# Patient Record
Sex: Female | Born: 1975
Health system: Southern US, Community
[De-identification: ages and names within clinical notes are randomized; demographics above are authoritative.]

## PROBLEM LIST (undated history)

## (undated) DIAGNOSIS — J42 Unspecified chronic bronchitis: Secondary | ICD-10-CM

## (undated) DIAGNOSIS — E119 Type 2 diabetes mellitus without complications: Secondary | ICD-10-CM

## (undated) DIAGNOSIS — K219 Gastro-esophageal reflux disease without esophagitis: Secondary | ICD-10-CM

## (undated) DIAGNOSIS — T7840XA Allergy, unspecified, initial encounter: Secondary | ICD-10-CM

## (undated) HISTORY — DX: Allergy, unspecified, initial encounter: T78.40XA

## (undated) HISTORY — DX: Unspecified chronic bronchitis: J42

## (undated) HISTORY — DX: Type 2 diabetes mellitus without complications: E11.9

## (undated) HISTORY — PX: TONSILECTOMY, ADENOIDECTOMY, BILATERAL MYRINGOTOMY AND TUBES: SHX2538

## (undated) HISTORY — DX: Gastro-esophageal reflux disease without esophagitis: K21.9

---

## 2018-12-21 ENCOUNTER — Other Ambulatory Visit: Payer: Self-pay

## 2018-12-21 DIAGNOSIS — Z20822 Contact with and (suspected) exposure to covid-19: Secondary | ICD-10-CM

## 2018-12-22 ENCOUNTER — Telehealth: Payer: Self-pay | Admitting: General Practice

## 2018-12-22 LAB — NOVEL CORONAVIRUS, NAA: SARS-CoV-2, NAA: NOT DETECTED

## 2018-12-22 NOTE — Telephone Encounter (Signed)
Negative COVID results given. Patient results "NOT Detected." Caller expressed understanding. ° °

## 2019-05-17 ENCOUNTER — Other Ambulatory Visit: Payer: Self-pay | Admitting: Sports Medicine

## 2019-05-17 DIAGNOSIS — M25511 Pain in right shoulder: Secondary | ICD-10-CM

## 2019-05-25 ENCOUNTER — Ambulatory Visit
Admission: RE | Admit: 2019-05-25 | Discharge: 2019-05-25 | Disposition: A | Discharge status: Home / Self Care | Payer: 59 | Source: Ambulatory Visit | Attending: Sports Medicine | Admitting: Sports Medicine

## 2019-05-25 DIAGNOSIS — M25511 Pain in right shoulder: Secondary | ICD-10-CM

## 2019-06-16 ENCOUNTER — Other Ambulatory Visit: Payer: Self-pay | Admitting: Internal Medicine

## 2019-06-16 DIAGNOSIS — Z1231 Encounter for screening mammogram for malignant neoplasm of breast: Secondary | ICD-10-CM

## 2019-06-23 ENCOUNTER — Other Ambulatory Visit: Payer: Self-pay | Admitting: Internal Medicine

## 2019-06-23 DIAGNOSIS — S2002XA Contusion of left breast, initial encounter: Secondary | ICD-10-CM

## 2019-06-30 ENCOUNTER — Other Ambulatory Visit: Payer: Self-pay

## 2019-06-30 ENCOUNTER — Other Ambulatory Visit (HOSPITAL_COMMUNITY): Payer: Self-pay | Admitting: Internal Medicine

## 2019-06-30 ENCOUNTER — Ambulatory Visit (HOSPITAL_COMMUNITY)
Admission: RE | Admit: 2019-06-30 | Discharge: 2019-06-30 | Disposition: A | Discharge status: Home / Self Care | Payer: 59 | Source: Ambulatory Visit | Attending: Internal Medicine | Admitting: Internal Medicine

## 2019-06-30 DIAGNOSIS — R05 Cough: Secondary | ICD-10-CM

## 2019-06-30 DIAGNOSIS — R059 Cough, unspecified: Secondary | ICD-10-CM

## 2019-07-07 ENCOUNTER — Ambulatory Visit: Payer: 59

## 2019-07-07 ENCOUNTER — Other Ambulatory Visit: Payer: Self-pay

## 2019-07-07 ENCOUNTER — Ambulatory Visit
Admission: RE | Admit: 2019-07-07 | Discharge: 2019-07-07 | Disposition: A | Discharge status: Home / Self Care | Payer: 59 | Source: Ambulatory Visit | Attending: Internal Medicine | Admitting: Internal Medicine

## 2019-07-07 ENCOUNTER — Other Ambulatory Visit: Payer: Self-pay | Admitting: Internal Medicine

## 2019-07-07 DIAGNOSIS — N6310 Unspecified lump in the right breast, unspecified quadrant: Secondary | ICD-10-CM

## 2019-07-07 DIAGNOSIS — S2002XA Contusion of left breast, initial encounter: Secondary | ICD-10-CM

## 2020-01-11 ENCOUNTER — Other Ambulatory Visit: Payer: 59
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
## 2021-10-21 ENCOUNTER — Other Ambulatory Visit: Payer: Self-pay

## 2021-10-21 ENCOUNTER — Encounter: Payer: Self-pay | Admitting: Physician Assistant

## 2021-10-21 ENCOUNTER — Ambulatory Visit: Payer: BC Managed Care – PPO | Admitting: Physician Assistant

## 2021-10-21 VITALS — BP 136/86 | HR 80 | Temp 97.8°F | Ht 62.68 in | Wt 241.0 lb

## 2021-10-21 DIAGNOSIS — Z23 Encounter for immunization: Secondary | ICD-10-CM

## 2021-10-21 DIAGNOSIS — Z114 Encounter for screening for human immunodeficiency virus [HIV]: Secondary | ICD-10-CM | POA: Diagnosis not present

## 2021-10-21 DIAGNOSIS — Z1159 Encounter for screening for other viral diseases: Secondary | ICD-10-CM

## 2021-10-21 DIAGNOSIS — R1011 Right upper quadrant pain: Secondary | ICD-10-CM

## 2021-10-21 DIAGNOSIS — E1165 Type 2 diabetes mellitus with hyperglycemia: Secondary | ICD-10-CM

## 2021-10-21 DIAGNOSIS — Z13220 Encounter for screening for lipoid disorders: Secondary | ICD-10-CM

## 2021-10-21 DIAGNOSIS — H60543 Acute eczematoid otitis externa, bilateral: Secondary | ICD-10-CM

## 2021-10-21 DIAGNOSIS — D1803 Hemangioma of intra-abdominal structures: Secondary | ICD-10-CM

## 2021-10-21 LAB — COMPREHENSIVE METABOLIC PANEL
ALT: 12 U/L (ref 0–35)
AST: 11 U/L (ref 0–37)
Albumin: 4.1 g/dL (ref 3.5–5.2)
Alkaline Phosphatase: 83 U/L (ref 39–117)
BUN: 9 mg/dL (ref 6–23)
CO2: 29 mEq/L (ref 19–32)
Calcium: 9.3 mg/dL (ref 8.4–10.5)
Chloride: 101 mEq/L (ref 96–112)
Creatinine, Ser: 0.56 mg/dL (ref 0.40–1.20)
GFR: 110.83 mL/min (ref >60.00)
Glucose, Bld: 208 mg/dL — ABNORMAL HIGH (ref 70–99)
Potassium: 4 mEq/L (ref 3.5–5.1)
Sodium: 139 mEq/L (ref 135–145)
Total Bilirubin: 1 mg/dL (ref 0.2–1.2)
Total Protein: 7.3 g/dL (ref 6.0–8.3)

## 2021-10-21 LAB — LIPID PANEL
Cholesterol: 246 mg/dL — ABNORMAL HIGH (ref 0–200)
HDL: 40.5 mg/dL (ref >39.00)
LDL Cholesterol: 181 mg/dL — ABNORMAL HIGH (ref 0–99)
NonHDL: 205.43
Total CHOL/HDL Ratio: 6
Triglycerides: 124 mg/dL (ref 0.0–149.0)
VLDL: 24.8 mg/dL (ref 0.0–40.0)

## 2021-10-21 LAB — CBC WITH DIFFERENTIAL/PLATELET
Basophils Absolute: 0.1 10*3/uL (ref 0.0–0.1)
Basophils Relative: 0.7 % (ref 0.0–3.0)
Eosinophils Absolute: 0.1 10*3/uL (ref 0.0–0.7)
Eosinophils Relative: 1.7 % (ref 0.0–5.0)
HCT: 40.4 % (ref 36.0–46.0)
Hemoglobin: 13.8 g/dL (ref 12.0–15.0)
Lymphocytes Relative: 26.8 % (ref 12.0–46.0)
Lymphs Abs: 2.2 10*3/uL (ref 0.7–4.0)
MCHC: 34.2 g/dL (ref 30.0–36.0)
MCV: 97.9 fl (ref 78.0–100.0)
Monocytes Absolute: 0.3 10*3/uL (ref 0.1–1.0)
Monocytes Relative: 3.2 % (ref 3.0–12.0)
Neutro Abs: 5.6 10*3/uL (ref 1.4–7.7)
Neutrophils Relative %: 67.6 % (ref 43.0–77.0)
Platelets: 239 10*3/uL (ref 150.0–400.0)
RBC: 4.13 Mil/uL (ref 3.87–5.11)
RDW: 13.1 % (ref 11.5–15.5)
WBC: 8.3 10*3/uL (ref 4.0–10.5)

## 2021-10-21 LAB — HEMOGLOBIN A1C: Hgb A1c MFr Bld: 10.1 % — ABNORMAL HIGH (ref 4.6–6.5)

## 2021-10-21 LAB — TSH: TSH: 1.51 u[IU]/mL (ref 0.35–5.50)

## 2021-10-21 MED ORDER — FLUOCINOLONE ACETONIDE 0.01 % OT OIL: 1.0000 [drp] | TOPICAL_OIL | Freq: Two times a day (BID) | OTIC | 0 refills | Status: AC
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
## 2021-10-21 NOTE — Patient Instructions (Addendum)
Welcome to [facility]! It was a pleasure meeting you today.  As discussed, Please schedule a 1-2 month f/up to recheck on Mounjaro and also update Well Woman Exam.  Fasting labs today Tetanus updated today Call sooner if any concerns  PLEASE NOTE:  If you had any LAB tests please let us know if you have not heard back within a few days. You may see your results on MyChart before we have a chance to review them but we will give you a call once they are reviewed by [place]. If we ordered any REFERRALS today, please let us know if you have not heard from their office within the next two weeks. Let us know through MyChart if you are needing REFILLS, or have your pharmacy send [place] the request. You can also use MyChart to communicate with me or any office staff.  Please try these tips to maintain a healthy lifestyle:  Eat most of your calories during the day when you are active. Eliminate processed foods including packaged sweets (pies, cakes, cookies), reduce intake of potatoes, white bread, white pasta, and white rice. Look for whole grain options, oat flour or almond flour.  Each meal should contain half fruits/vegetables, one quarter protein, and one quarter carbs (no bigger than a computer mouse).  Cut down on sweet beverages. This includes juice, soda, and sweet tea. Also watch fruit intake, though this is a healthier sweet option, it still contains natural sugar! Limit to 3 servings daily.  Drink at least 1 glass of water with each meal and aim for at least 8 glasses (64 ounces) per day.  Exercise at least 150 minutes every week to the best of your ability.    Take Care,  Tametria Aho, PA-C

## 2021-10-22 LAB — HEPATITIS C ANTIBODY: Hepatitis C Ab: NONREACTIVE

## 2021-10-22 LAB — HIV ANTIBODY (ROUTINE TESTING W REFLEX): HIV 1&2 Ab, 4th Generation: NONREACTIVE

## 2021-10-22 LAB — MICROALBUMIN / CREATININE URINE RATIO
Creatinine,U: 105.9 mg/dL
Microalb Creat Ratio: 5 mg/g (ref 0.0–30.0)
Microalb, Ur: 5.2 mg/dL — ABNORMAL HIGH (ref 0.0–1.9)

## 2021-10-25 ENCOUNTER — Other Ambulatory Visit: Payer: Self-pay

## 2021-10-25 MED ORDER — FLUCONAZOLE 150 MG PO TABS: 150.0000 mg | ORAL_TABLET | ORAL | 0 refills | Status: DC | PRN

## 2021-10-25 MED ORDER — ATORVASTATIN CALCIUM 10 MG PO TABS: 10.0000 mg | ORAL_TABLET | Freq: Every day | ORAL | 3 refills | Status: DC

## 2021-11-05 ENCOUNTER — Telehealth: Payer: Self-pay

## 2021-11-05 NOTE — Telephone Encounter (Signed)
Contacted pt and lvm advising to contact [facility] to schedule an appt for Tanya Zamora that was ordered. Per Lattie Haw pt needs to call and just schedule an appt; Also if she runs into any issues scheduling to let me know and we will attempt to schedule for her.

## 2021-11-18 ENCOUNTER — Encounter: Payer: Self-pay | Admitting: Physician Assistant

## 2021-11-18 ENCOUNTER — Telehealth: Payer: Self-pay

## 2021-11-18 ENCOUNTER — Ambulatory Visit: Payer: BC Managed Care – PPO | Admitting: Physician Assistant

## 2021-11-18 VITALS — BP 136/86 | HR 85 | Temp 98.2°F | Ht 62.0 in | Wt 241.0 lb

## 2021-11-18 DIAGNOSIS — E1165 Type 2 diabetes mellitus with hyperglycemia: Secondary | ICD-10-CM | POA: Diagnosis not present

## 2021-11-18 DIAGNOSIS — R1011 Right upper quadrant pain: Secondary | ICD-10-CM

## 2021-11-18 DIAGNOSIS — E782 Mixed hyperlipidemia: Secondary | ICD-10-CM

## 2021-11-18 DIAGNOSIS — D1803 Hemangioma of intra-abdominal structures: Secondary | ICD-10-CM | POA: Diagnosis not present

## 2021-11-18 MED ORDER — TIRZEPATIDE 5 MG/0.5ML ~~LOC~~ SOAJ: 5.0000 mg | SUBCUTANEOUS | 2 refills | Status: DC

## 2021-11-18 NOTE — Assessment & Plan Note (Signed)
Recently starting Lipitor 10 mg.  Starting to have leg cramps.  Plan to discontinue this medication and see if symptoms resolve.  We may need to try a different statin or a much slower dose in the future.

## 2021-11-18 NOTE — Assessment & Plan Note (Signed)
As noted on previous ultrasound from last year.  My CMA was able to get her scheduled for repeat ultrasound this week.  If any changes, may proceed with MRI at that time.

## 2021-11-18 NOTE — Patient Instructions (Signed)
STOP the Lipitor - let me know in the next few weeks if cramping resolves.  Increase Mounjaro to 5 mg once weekly - Rx sent to pharmacy - take Coupon Code from online

## 2021-11-18 NOTE — Telephone Encounter (Signed)
ICEL CASTLES KeyColleen Can -  Rx #: [id number]  Outcome Approved  Effective from 11/18/2021 through 11/17/2022.  Drug Mounjaro '5MG'$ /0.5ML pen-injectors

## 2021-11-18 NOTE — Progress Notes (Signed)
Subjective:    Patient ID: Tanya Zamora, female    DOB: 30-Oct-1975, 46 y.o.   MRN: [id number]  Chief Complaint  Patient presents with   Follow-up    Pt in for 4 wk f/u; pt thinks shot is going well it has curbed appetite but notices 2-3 days before taking next shot appetite is back up, wondering if she needs to go up on dosage? Pt has been active in weight loss exercise program 2-3 days per week for 6 months with little results, also counting calories and macros. Pt is fasting incase wanting to count this as physical and fasting labs    HPI Patient is in today for f/up.  T2DM -  Taking Mounjaro - only had sick feeling after 1st shot, o/w tolerating well now. Drinking more water. Trying to walk more, but says she walks constantly at work. Active in wt loss exercise program for the last 6 months w/o results. She is counting calories and macros.   Occasional sharp pain in RUQ - originally thought GB issues - abnormal US abdomen 08/15/20 -  "ADDENDUM: 1.2 x 0.8 x 1.0 cm echogenic lesion noted in the inferior tip of the left hepatic lobe, likely a hemangioma. Recommend contrast enhanced MRI in 6 months to document stability and confirm diagnosis." -Pt did not f/up on this b/c she lost insurance soon after  Starting to have some cramping in legs at night and in the mornings. Just started lipitor 10 mg in the last few weeks.   Past Medical History:  Diagnosis Date   Allergy    Chronic bronchitis (HCC)    Diabetes mellitus without complication (HCC)    GERD (gastroesophageal reflux disease)     Past Surgical History:  Procedure Laterality Date   TONSILECTOMY, ADENOIDECTOMY, BILATERAL MYRINGOTOMY AND TUBES Bilateral     Family History  Problem Relation Age of Onset   Breast cancer Mother 80   Diabetes Mother    Stroke Mother    COPD Mother    Hypertension Mother    Hypertension Father    Cancer Father    Breast cancer Sister    Breast cancer Maternal Grandmother      Social History   Tobacco Use   Smoking status: Former    Packs/day: 1.00    Years: 30.00    Total pack years: 30.00    Types: Cigarettes    Quit date: 01/19/2021    Years since quitting: 0.8   Smokeless tobacco: Never  Vaping Use   Vaping Use: Every day  Substance Use Topics   Alcohol use: Not Currently   Drug use: Not Currently     Allergies  Allergen Reactions   Metformin And Related Diarrhea and Nausea And Vomiting    Review of Systems NEGATIVE UNLESS OTHERWISE INDICATED IN HPI      Objective:     BP 136/86 (BP Location: Left Arm)   Pulse 85   Temp 98.2 F (36.8 C) (Temporal)   Ht '5\' 2"'$  (1.575 m)   Wt 241 lb (109.3 kg)   LMP 11/13/2021 (Exact Date)   SpO2 95%   BMI 44.08 kg/m   Wt Readings from Last 3 Encounters:  11/18/21 241 lb (109.3 kg)  10/21/21 241 lb (109.3 kg)    BP Readings from Last 3 Encounters:  11/18/21 136/86  10/21/21 136/86     Physical Exam Constitutional:      General: She is not in acute distress.    Appearance: Normal appearance. She  is obese. She is not ill-appearing.  Eyes:     Extraocular Movements: Extraocular movements intact.     Pupils: Pupils are equal, round, and reactive to light.  Cardiovascular:     Rate and Rhythm: Normal rate and regular rhythm.     Heart sounds: Normal heart sounds.  Pulmonary:     Effort: Pulmonary effort is normal.     Breath sounds: Normal breath sounds.  Neurological:     General: No focal deficit present.     Mental Status: She is alert and oriented to person, place, and time.  Psychiatric:        Mood and Affect: Mood normal.        Behavior: Behavior normal.        Assessment & Plan:   Problem List Items Addressed This Visit       Cardiovascular and Mediastinum   Hemangioma of liver    As noted on previous ultrasound from last year.  My CMA was able to get her scheduled for repeat ultrasound this week.  If any changes, may proceed with MRI at that time.         Endocrine   Type 2 diabetes mellitus with hyperglycemia, without long-term current use of insulin ([place]) - Primary    Not due for next A1c yet Doing well with lifestyle changes She has completed 1 month of Mounjaro starting dose; plan to increase to the 5 mg once weekly for the next 4 to 8 weeks.  Advised coupon code online.  She will let me know if she is having any problems with this medication. Plan to recheck labs at next visit.      Relevant Medications   tirzepatide [facility]) 5 MG/0.5ML Pen     Other   Morbid obesity ([place])    Encouraged patient to keep working on lifestyle changes.  Darcel Bayley should also be helpful as well.      Relevant Medications   tirzepatide (MOUNJARO) 5 MG/0.5ML Pen   RUQ pain    Rare if ever anymore.  Initially thought gallbladder disease last year, but this was ruled out via ultrasound.  She does not have any pain today.      Mixed hyperlipidemia    Recently starting Lipitor 10 mg.  Starting to have leg cramps.  Plan to discontinue this medication and see if symptoms resolve.  We may need to try a different statin or a much slower dose in the future.        Meds ordered this encounter  Medications   tirzepatide (MOUNJARO) 5 MG/0.5ML Pen    Sig: Inject 5 mg into the skin once a week.    Dispense:  6 mL    Refill:  2    Order Specific Question:   Supervising Provider    Answer:   Marin Olp [[id number]]     Return in about 2 months (around 01/18/2022) for fasting labs, Well Woman Exam .   Randa Evens Itay Mella, PA-C

## 2021-11-18 NOTE — Assessment & Plan Note (Signed)
Encouraged patient to keep working on lifestyle changes.  Tanya Zamora should also be helpful as well.

## 2021-11-18 NOTE — Assessment & Plan Note (Signed)
Not due for next A1c yet Doing well with lifestyle changes She has completed 1 month of Mounjaro starting dose; plan to increase to the 5 mg once weekly for the next 4 to 8 weeks.  Advised coupon code online.  She will let me know if she is having any problems with this medication. Plan to recheck labs at next visit.

## 2021-11-18 NOTE — Assessment & Plan Note (Signed)
Rare if ever anymore.  Initially thought gallbladder disease last year, but this was ruled out via ultrasound.  She does not have any pain today.

## 2021-11-19 ENCOUNTER — Ambulatory Visit (HOSPITAL_COMMUNITY)
Admission: RE | Admit: 2021-11-19 | Discharge: 2021-11-19 | Disposition: A | Discharge status: Home / Self Care | Payer: BC Managed Care – PPO | Source: Ambulatory Visit | Attending: Physician Assistant | Admitting: Physician Assistant

## 2021-11-19 DIAGNOSIS — R945 Abnormal results of liver function studies: Secondary | ICD-10-CM | POA: Diagnosis not present

## 2021-11-19 DIAGNOSIS — R1011 Right upper quadrant pain: Secondary | ICD-10-CM

## 2021-11-19 DIAGNOSIS — D1803 Hemangioma of intra-abdominal structures: Secondary | ICD-10-CM | POA: Diagnosis not present

## 2021-12-16 ENCOUNTER — Encounter: Payer: Self-pay | Admitting: *Deleted

## 2022-01-21 ENCOUNTER — Ambulatory Visit: Payer: BC Managed Care – PPO | Admitting: Physician Assistant

## 2022-01-27 ENCOUNTER — Ambulatory Visit: Payer: BC Managed Care – PPO | Admitting: Physician Assistant

## 2022-01-28 ENCOUNTER — Telehealth (INDEPENDENT_AMBULATORY_CARE_PROVIDER_SITE_OTHER): Payer: BC Managed Care – PPO | Admitting: Physician Assistant

## 2022-01-28 ENCOUNTER — Telehealth: Payer: Self-pay

## 2022-01-28 VITALS — Temp 97.9°F | Ht 62.0 in | Wt 240.0 lb

## 2022-01-28 DIAGNOSIS — J069 Acute upper respiratory infection, unspecified: Secondary | ICD-10-CM

## 2022-01-28 DIAGNOSIS — A084 Viral intestinal infection, unspecified: Secondary | ICD-10-CM

## 2022-01-28 MED ORDER — ONDANSETRON HCL 8 MG PO TABS: 8.0000 mg | ORAL_TABLET | Freq: Three times a day (TID) | ORAL | 0 refills | Status: DC | PRN

## 2022-01-28 MED ORDER — CHERATUSSIN AC 100-10 MG/5ML PO SOLN: 5.0000 mL | Freq: Three times a day (TID) | ORAL | 0 refills | Status: DC | PRN

## 2022-01-28 MED ORDER — FLUTICASONE PROPIONATE HFA 44 MCG/ACT IN AERO: 2.0000 | INHALATION_SPRAY | Freq: Two times a day (BID) | RESPIRATORY_TRACT | 0 refills | Status: DC

## 2022-01-28 NOTE — Progress Notes (Signed)
   Virtual Visit via Video Note  I connected with  Tanya Zamora  on 01/28/22 at  1:45 PM EST by a video enabled telemedicine application and verified that I am speaking with the correct person using two identifiers.  Location: Patient: home Provider: Therapist, music at [place] present: Patient and myself   I discussed the limitations of evaluation and management by telemedicine and the availability of in person appointments. The patient expressed understanding and agreed to proceed.   History of Present Illness:  46 yo presents for virtual visit. Sick x 1 week. 3 home COVID tests all negative. Bad cough and congestion not letting up.  Pt having constant cough with no relief and no sleep spent past 24-36 hours with vomiting and diarrhea. Hx of bronchitis this time of year and always has this issue.   Nausea / vomiting is better today. Diarrhea is starting to slow down today.  No blood in the stool. Some dizziness, no passing out.  Has not felt feverish today.   No sick contacts at home.   Tx's at home: Tylenol and ibuprofen; tessalon perles don't help; has needed inhaler before and that was helpful   Observations/Objective:   Gen: Awake, alert, no acute distress, coughing, congested sounding Resp: Breathing is even and non-labored Psych: calm/pleasant demeanor Neuro: Alert and Oriented x 3, + facial symmetry, speech is clear.   Assessment and Plan:  1. Upper respiratory infection with cough and congestion Symptoms slowly improving. Treat conservatively at this time. Cheratussin & Flovent as directed to help with cough symptoms.  Pt aware of risks vs benefits and possible adverse reactions Fluids, rest, nasal saline.  2. Viral gastroenteritis Resolving Zofran 8 mg as directed prn BRATY diet   Follow Up Instructions:    I discussed the assessment and treatment plan with the patient. The patient was provided an opportunity to ask questions  and all were answered. The patient agreed with the plan and demonstrated an understanding of the instructions.   The patient was advised to call back or seek an in-person evaluation if the symptoms worsen or if the condition fails to improve as anticipated.  Samya Siciliano M Kami Kube, PA-C

## 2022-01-28 NOTE — Telephone Encounter (Signed)
Patient is needing a fill of zofran for current N/V and diarrhea

## 2022-01-28 NOTE — Telephone Encounter (Signed)
Noted -o/v completed today.

## 2022-02-11 ENCOUNTER — Ambulatory Visit: Payer: BC Managed Care – PPO | Admitting: Physician Assistant

## 2022-02-11 ENCOUNTER — Other Ambulatory Visit (HOSPITAL_COMMUNITY)
Admission: RE | Admit: 2022-02-11 | Discharge: 2022-02-11 | Disposition: A | Discharge status: Home / Self Care | Payer: BC Managed Care – PPO | Source: Ambulatory Visit | Attending: Physician Assistant | Admitting: Physician Assistant

## 2022-02-11 ENCOUNTER — Encounter: Payer: Self-pay | Admitting: Physician Assistant

## 2022-02-11 VITALS — BP 120/76 | HR 76 | Temp 97.1°F | Ht 62.6 in | Wt 238.0 lb

## 2022-02-11 DIAGNOSIS — Z01419 Encounter for gynecological examination (general) (routine) without abnormal findings: Secondary | ICD-10-CM | POA: Insufficient documentation

## 2022-02-11 DIAGNOSIS — Z0000 Encounter for general adult medical examination without abnormal findings: Secondary | ICD-10-CM

## 2022-02-11 DIAGNOSIS — E782 Mixed hyperlipidemia: Secondary | ICD-10-CM

## 2022-02-11 DIAGNOSIS — E1165 Type 2 diabetes mellitus with hyperglycemia: Secondary | ICD-10-CM

## 2022-02-11 DIAGNOSIS — J309 Allergic rhinitis, unspecified: Secondary | ICD-10-CM

## 2022-02-11 LAB — POC URINALSYSI DIPSTICK (AUTOMATED)
Bilirubin, UA: NEGATIVE
Blood, UA: NEGATIVE
Glucose, UA: POSITIVE — AB
Ketones, UA: NEGATIVE
Nitrite, UA: NEGATIVE
Protein, UA: NEGATIVE
Spec Grav, UA: 1.025 (ref 1.010–1.025)
Urobilinogen, UA: 0.2 E.U./dL
pH, UA: 6 (ref 5.0–8.0)

## 2022-02-11 LAB — POCT GLYCOSYLATED HEMOGLOBIN (HGB A1C): Hemoglobin A1C: 8.1 % — AB (ref 4.0–5.6)

## 2022-02-11 MED ORDER — PRAVASTATIN SODIUM 20 MG PO TABS: 20.0000 mg | ORAL_TABLET | ORAL | 2 refills | Status: DC

## 2022-02-11 MED ORDER — GLIPIZIDE ER 2.5 MG PO TB24: 2.5000 mg | ORAL_TABLET | Freq: Every day | ORAL | 2 refills | Status: DC

## 2022-02-11 MED ORDER — BLOOD GLUCOSE MONITOR KIT: PACK | 0 refills | Status: DC

## 2022-02-11 MED ORDER — FLUTICASONE PROPIONATE 50 MCG/ACT NA SUSP: 2.0000 | Freq: Every day | NASAL | 2 refills | Status: DC

## 2022-02-11 NOTE — Progress Notes (Signed)
Subjective:    Patient ID: Tanya Zamora, female    DOB: 1975/11/17, 46 y.o.   MRN: [id number]  Chief Complaint  Patient presents with   Gynecologic Exam    Pt in for Well woman exam; and follow up; just started back on Mounjaro, wants to continue same dose for now, also would like to discuss starting Flonase to help with seasonal allergies.     HPI Patient is in today for well woman exam.  Patient has not had a Pap smear in at least the last 12 years.  States that she does have a history of colposcopy over a decade ago.  Patient is turning 46 next month and will need a colonoscopy, she is willing to accept a referral today.  She also needs to follow-up today on her chronic medical conditions.  Past Medical History:  Diagnosis Date   Allergy    Chronic bronchitis ([place])    Diabetes mellitus without complication (HCC)    GERD (gastroesophageal reflux disease)     Past Surgical History:  Procedure Laterality Date   TONSILECTOMY, ADENOIDECTOMY, BILATERAL MYRINGOTOMY AND TUBES Bilateral     Family History  Problem Relation Age of Onset   Breast cancer Mother 58   Diabetes Mother    Stroke Mother    COPD Mother    Hypertension Mother    Hypertension Father    Cancer Father    Breast cancer Sister    Breast cancer Maternal Grandmother     Social History   Tobacco Use   Smoking status: Former    Packs/day: 1.00    Years: 30.00    Total pack years: 30.00    Types: Cigarettes    Quit date: 01/19/2021    Years since quitting: 1.0   Smokeless tobacco: Never  Vaping Use   Vaping Use: Every day  Substance Use Topics   Alcohol use: Not Currently   Drug use: Not Currently     Allergies  Allergen Reactions   Metformin And Related Diarrhea and Nausea And Vomiting    Review of Systems NEGATIVE UNLESS OTHERWISE INDICATED IN HPI      Objective:     BP 120/76 (BP Location: Left Arm)   Pulse 76   Temp (!) 97.1 F (36.2 C) (Temporal)   Ht 5' 2.6" (1.59 m)   Wt  238 lb (108 kg)   LMP 01/31/2022 (Approximate)   SpO2 98%   BMI 42.70 kg/m   Wt Readings from Last 3 Encounters:  02/11/22 238 lb (108 kg)  01/28/22 240 lb (108.9 kg)  11/18/21 241 lb (109.3 kg)    BP Readings from Last 3 Encounters:  02/11/22 120/76  11/18/21 136/86  10/21/21 136/86     Physical Exam Vitals and nursing note reviewed. Exam conducted with a chaperone present.  Constitutional:      Appearance: Normal appearance. She is obese. She is not toxic-appearing.  HENT:     Head: Normocephalic and atraumatic.     Right Ear: Tympanic membrane, ear canal and external ear normal.     Left Ear: Tympanic membrane, ear canal and external ear normal.     Nose: Nose normal.     Mouth/Throat:     Mouth: Mucous membranes are moist.  Eyes:     Extraocular Movements: Extraocular movements intact.     Conjunctiva/sclera: Conjunctivae normal.     Pupils: Pupils are equal, round, and reactive to light.  Cardiovascular:     Rate and Rhythm: Normal rate  and regular rhythm.     Pulses: Normal pulses.     Heart sounds: Normal heart sounds.  Pulmonary:     Effort: Pulmonary effort is normal.     Breath sounds: Normal breath sounds.  Chest:     Chest wall: No mass, swelling or tenderness.  Breasts:    Right: Normal. No swelling, bleeding, inverted nipple, mass, nipple discharge, skin change or tenderness.     Left: Normal. No swelling, bleeding, inverted nipple, mass, nipple discharge, skin change or tenderness.  Abdominal:     General: Abdomen is flat. Bowel sounds are normal.     Palpations: Abdomen is soft.     Hernia: There is no hernia in the left inguinal area or right inguinal area.  Genitourinary:    General: Normal vulva.     Labia:        Right: No rash, tenderness or lesion.        Left: No rash, tenderness or lesion.      Vagina: Normal.     Cervix: Normal.     Uterus: Normal.      Adnexa: Right adnexa normal and left adnexa normal.  Musculoskeletal:         General: Normal range of motion.     Cervical back: Normal range of motion and neck supple.  Lymphadenopathy:     Upper Body:     Right upper body: No supraclavicular or axillary adenopathy.     Left upper body: No supraclavicular or axillary adenopathy.  Skin:    General: Skin is warm and dry.  Neurological:     General: No focal deficit present.     Mental Status: She is alert and oriented to person, place, and time.  Psychiatric:        Mood and Affect: Mood normal.        Behavior: Behavior normal.        Thought Content: Thought content normal.        Judgment: Judgment normal.        Assessment & Plan:  Encounter for well woman exam Assessment & Plan: Well woman exam completed today.  She is going to schedule on her own for mammogram.  We will call with Pap results.  Referral placed for colonoscopy.  Encouraged patient to keep working on lifestyle habits.  Orders: -     Cytology - PAP -     POCT Urinalysis Dipstick (Automated)  Type 2 diabetes mellitus with hyperglycemia, without long-term current use of insulin (HCC) Assessment & Plan: Lab Results  Component Value Date   HGBA1C 8.1 (A) 02/11/2022   Has significant improvement in her hemoglobin A1c.  Plan to increase Mounjaro to 5 mg once weekly.  She was unable to tolerate metformin.  I do think she would benefit from additional glucose control with the [facility].  Plan to start on glipizide XL 2.5 mg once daily.  Possible side effects cautioned.  Provided sample glucometer to monitor sugars intermittently. Strongly emphasized keep working on lifestyle changes.   Orders: -     POCT glycosylated hemoglobin (Hb A1C) -     blood glucose meter kit and supplies; Dispense based on patient and insurance preference. Use up to four times daily as directed.  Dispense: 1 each; Refill: 0  Mixed hyperlipidemia Assessment & Plan: The 10-year ASCVD risk score (Arnett DK, et al., 2019) is: 3%  Diabetic, didn't tolerate lipitor 10  mg due to myalgia. Start on Pravachol 20 mg one tab  2-3 times per week, very slowly increase as long as tolerating well.    Morbid obesity (HCC)  Allergic rhinitis, unspecified seasonality, unspecified trigger  Other orders -     Pravastatin Sodium; Take 1 tablet (20 mg total) by mouth every other day.  Dispense: 30 tablet; Refill: 2 -     glipiZIDE ER; Take 1 tablet (2.5 mg total) by mouth daily with breakfast.  Dispense: 30 tablet; Refill: 2 -     Fluticasone Propionate; Place 2 sprays into both nostrils daily.  Dispense: 16 g; Refill: 2        Return in about 3 months (around 05/14/2022) for recheck, fasting labs .  This note was prepared with assistance of Systems analyst. Occasional wrong-word or sound-a-like substitutions may have occurred due to the inherent limitations of voice recognition software.   Duey Liller M Amal Saiki, PA-C

## 2022-02-15 DIAGNOSIS — J309 Allergic rhinitis, unspecified: Secondary | ICD-10-CM | POA: Insufficient documentation

## 2022-02-15 NOTE — Assessment & Plan Note (Signed)
The 10-year ASCVD risk score (Arnett DK, et al., 2019) is: 3%  Diabetic, didn't tolerate lipitor 10 mg due to myalgia. Start on Pravachol 20 mg one tab 2-3 times per week, very slowly increase as long as tolerating well.

## 2022-02-15 NOTE — Assessment & Plan Note (Signed)
Lab Results  Component Value Date   HGBA1C 8.1 (A) 02/11/2022   Has significant improvement in her hemoglobin A1c.  Plan to increase Mounjaro to 5 mg once weekly.  She was unable to tolerate metformin.  I do think she would benefit from additional glucose control with the [facility].  Plan to start on glipizide XL 2.5 mg once daily.  Possible side effects cautioned.  Provided sample glucometer to monitor sugars intermittently. Strongly emphasized keep working on lifestyle changes.

## 2022-02-15 NOTE — Assessment & Plan Note (Signed)
Well woman exam completed today.  She is going to schedule on her own for mammogram.  We will call with Pap results.  Referral placed for colonoscopy.  Encouraged patient to keep working on lifestyle habits.

## 2022-02-17 LAB — CYTOLOGY - PAP
Chlamydia: NEGATIVE
Comment: NEGATIVE
Comment: NEGATIVE
Comment: NEGATIVE
Comment: NEGATIVE
Comment: NORMAL
Diagnosis: UNDETERMINED — AB
HSV1: NEGATIVE
HSV2: NEGATIVE
High risk HPV: POSITIVE — AB
Neisseria Gonorrhea: NEGATIVE
Trichomonas: NEGATIVE

## 2022-02-18 ENCOUNTER — Other Ambulatory Visit: Payer: Self-pay

## 2022-02-18 DIAGNOSIS — R87811 Vaginal high risk human papillomavirus (HPV) DNA test positive: Secondary | ICD-10-CM

## 2022-04-08 ENCOUNTER — Encounter: Payer: Self-pay | Admitting: Obstetrics and Gynecology

## 2022-04-08 ENCOUNTER — Ambulatory Visit (INDEPENDENT_AMBULATORY_CARE_PROVIDER_SITE_OTHER): Payer: BC Managed Care – PPO | Admitting: Obstetrics and Gynecology

## 2022-04-08 ENCOUNTER — Other Ambulatory Visit (HOSPITAL_COMMUNITY)
Admission: RE | Admit: 2022-04-08 | Discharge: 2022-04-08 | Disposition: A | Discharge status: Home / Self Care | Payer: BC Managed Care – PPO | Source: Ambulatory Visit | Attending: Obstetrics and Gynecology | Admitting: Obstetrics and Gynecology

## 2022-04-08 VITALS — BP 122/78 | HR 78 | Ht 62.6 in | Wt 238.0 lb

## 2022-04-08 DIAGNOSIS — R87610 Atypical squamous cells of undetermined significance on cytologic smear of cervix (ASC-US): Secondary | ICD-10-CM | POA: Insufficient documentation

## 2022-04-08 DIAGNOSIS — N870 Mild cervical dysplasia: Secondary | ICD-10-CM | POA: Diagnosis not present

## 2022-04-08 DIAGNOSIS — N3946 Mixed incontinence: Secondary | ICD-10-CM

## 2022-04-08 DIAGNOSIS — R87810 Cervical high risk human papillomavirus (HPV) DNA test positive: Secondary | ICD-10-CM | POA: Diagnosis not present

## 2022-04-08 DIAGNOSIS — Z01812 Encounter for preprocedural laboratory examination: Secondary | ICD-10-CM | POA: Diagnosis not present

## 2022-04-08 DIAGNOSIS — N819 Female genital prolapse, unspecified: Secondary | ICD-10-CM

## 2022-04-08 DIAGNOSIS — N72 Inflammatory disease of cervix uteri: Secondary | ICD-10-CM | POA: Diagnosis not present

## 2022-04-08 LAB — PREGNANCY, URINE: Preg Test, Ur: NEGATIVE

## 2022-04-08 NOTE — Progress Notes (Signed)
47 y.o. No obstetric history on file. Married White or Caucasian Unavailable female here for consultation from [employer], PA-C for evaluation of an ASCUS/+HPV pap from 02/11/22. The patient also wants to discuss genital prolapse.   Prior pap was 8 years ago. . The patient states that her primary noticed genital prolapse at the time of her exam in 11/23. The patient c/o a constant vaginal bulge. She voids frequently, small to large amounts, sometimes she doesn't feel empty, empties better in the am.  She has mixed incontinence. Leaks large amounts daily. She goes through a large pad 2 x a day.   She has intermittent constipation. Sometimes needs to reduce the prolapse to defecate.  Sexually active, some deep pain.  Also c/o intermittent pelvic pain and chronic lower back pain. She works as a Quarry manager.   Cycles are q 3-5 weeks x 6-7 days, could wear a pad all day.   Period Pattern: (!) Irregular Menstrual Flow: Moderate Menstrual Control: Maxi pad Dysmenorrhea: (!) Severe Dysmenorrhea Symptoms: Cramping  Patient's last menstrual period was 03/25/2022.          Sexually active: Yes.    The current method of family planning is none.    Exercising:   n/a Smoker:  no  Health Maintenance: Pap:  02/11/2022 HPV postive,- Atypical squamous cells of undetermined significance (ASC-US) Abnormal   History of abnormal Pap:  yes, years ago she had an abnormal pap and colposcopy. No surgery on her cervix.  MMG:  07/07/2019 BI-RADS CATEGORY 3: Probably benign.  BMD:   n/a Colonoscopy: n/a TDaP:  10/21/2021 Gardasil: n/a   reports that she quit smoking about 14 months ago. Her smoking use included cigarettes. She has a 30.00 pack-year smoking history. She has never used smokeless tobacco. She reports that she does not currently use alcohol. She reports that she does not currently use drugs. She works as a Quarry manager. Son is 17, senior in [employer].   Past Medical History:  Diagnosis Date   Allergy    Chronic  bronchitis ([place])    Diabetes mellitus without complication (HCC)    GERD (gastroesophageal reflux disease)     Past Surgical History:  Procedure Laterality Date   TONSILECTOMY, ADENOIDECTOMY, BILATERAL MYRINGOTOMY AND TUBES Bilateral     Current Outpatient Medications  Medication Sig Dispense Refill   blood glucose meter kit and supplies KIT Dispense based on patient and insurance preference. Use up to four times daily as directed. 1 each 0   fluconazole (DIFLUCAN) 150 MG tablet Take 1 tablet (150 mg total) by mouth as needed. 12 tablet 0   fluticasone (FLONASE ALLERGY RELIEF) 50 MCG/ACT nasal spray Place 2 sprays into both nostrils daily. 16 g 2   fluticasone (FLOVENT HFA) 44 MCG/ACT inhaler Inhale 2 puffs into the lungs 2 (two) times daily. Rinse mouth after use. 1 each 0   glipiZIDE (GLUCOTROL XL) 2.5 MG 24 hr tablet Take 1 tablet (2.5 mg total) by mouth daily with breakfast. 30 tablet 2   ondansetron (ZOFRAN) 8 MG tablet Take 1 tablet (8 mg total) by mouth every 8 (eight) hours as needed for nausea or vomiting. 20 tablet 0   pravastatin (PRAVACHOL) 20 MG tablet Take 1 tablet (20 mg total) by mouth every other day. 30 tablet 2   tirzepatide (MOUNJARO) 5 MG/0.5ML Pen Inject 5 mg into the skin once a week. 6 mL 2   No current facility-administered medications for this visit.    Family History  Problem Relation Age  of Onset   Breast cancer Mother 20   Diabetes Mother    Stroke Mother    COPD Mother    Hypertension Mother    Hypertension Father    Cancer Father    Breast cancer Sister    Breast cancer Maternal Grandmother     Review of Systems  All other systems reviewed and are negative.   Exam:   BP 122/78 (BP Location: Right Arm, Patient Position: Sitting, Cuff Size: Large)   Pulse 78   Ht 5' 2.6" (1.59 m)   Wt 238 lb (108 kg)   LMP 03/25/2022   BMI 42.70 kg/m   Weight change: '@WEIGHTCHANGE'$ @ Height:   Height: 5' 2.6" (159 cm)  Ht Readings from Last 3 Encounters:   04/08/22 5' 2.6" (1.59 m)  02/11/22 5' 2.6" (1.59 m)  01/28/22 '5\' 2"'$  (1.575 m)    General appearance: alert, cooperative and appears stated age   Pelvic: External genitalia:  no lesions              Urethra:  normal appearing urethra with no masses, tenderness or lesions              Bartholins and Skenes: normal                 Vagina: with valsalva she has a small grade 2 cystocele and rectocele. Only examined supine. She did leak urine with valsalva              Cervix: No lesions  Colposcopy: satisfactory, mild acetowhite changes at 4 and 12 o'clock, biopsies done, ECC done. Hemostasis obtained with silver nitrate. Negative lugols of vagina. With application of lugols, wide TZ is noted at 12 o'clock it extends towards her vagina.                 [employer], RMA chaperoned for the exam.  1. Atypical squamous cell changes of undetermined significance (ASCUS) on cervical cytology with positive high risk human papilloma virus (HPV) Colposcopy with biopsies and ECC done - Surgical pathology( [place])  2. Female genital prolapse, unspecified type Currently with small cystocele and rectocele. Will have her come back at the end of a day where she has been busy to further evaluate her prolapse. We discussed how prolapse can be more or less pronounced at times secondary to activity. We discussed options of doing nothing, using a pessary and surgery as treatment options for prolapse.  3. Mixed stress and urge urinary incontinence Discussed possible treatment options. Discussed effects of pessary on incontinence. Will make further plans after her next visit  4. Pre-procedure lab exam - Pregnancy, urine  CC: Alyssa Allwardt, PA-C Note sent

## 2022-04-13 LAB — SURGICAL PATHOLOGY

## 2022-04-16 ENCOUNTER — Other Ambulatory Visit: Payer: Self-pay | Admitting: *Deleted

## 2022-04-16 DIAGNOSIS — N870 Mild cervical dysplasia: Secondary | ICD-10-CM

## 2022-04-29 ENCOUNTER — Ambulatory Visit: Payer: BC Managed Care – PPO | Admitting: Obstetrics and Gynecology

## 2022-04-30 ENCOUNTER — Encounter: Payer: Self-pay | Admitting: Obstetrics and Gynecology

## 2022-04-30 ENCOUNTER — Ambulatory Visit: Payer: BC Managed Care – PPO | Admitting: Obstetrics and Gynecology

## 2022-04-30 VITALS — BP 132/84 | HR 82 | Wt 239.0 lb

## 2022-04-30 DIAGNOSIS — N819 Female genital prolapse, unspecified: Secondary | ICD-10-CM

## 2022-04-30 DIAGNOSIS — N3946 Mixed incontinence: Secondary | ICD-10-CM | POA: Diagnosis not present

## 2022-04-30 NOTE — Progress Notes (Unsigned)
GYNECOLOGY  VISIT   HPI: 47 y.o.   Married White or Caucasian Unavailable  female   No obstetric history on file. with Patient's last menstrual period was 03/25/2022.   here for evaluation of prolapse.  She c/o a vaginal bulge. Has mixed incontinence, GSI>>>>Urge, leaks large amounts daily.   She is also having a leep on Friday and would like something for the anxiety before.   GYNECOLOGIC HISTORY: Patient's last menstrual period was 03/25/2022. Contraception:none  Menopausal hormone therapy: none         OB History   No obstetric history on file.        Patient Active Problem List   Diagnosis Date Noted   Allergic rhinitis 02/15/2022   Encounter for well woman exam 02/11/2022   Morbid obesity ([place]) 11/18/2021   Type 2 diabetes mellitus with hyperglycemia, without long-term current use of insulin ([place]) 11/18/2021   RUQ pain 11/18/2021   Hemangioma of liver 11/18/2021   Mixed hyperlipidemia 11/18/2021    Past Medical History:  Diagnosis Date   Allergy    Chronic bronchitis ([place])    Diabetes mellitus without complication (HCC)    GERD (gastroesophageal reflux disease)     Past Surgical History:  Procedure Laterality Date   TONSILECTOMY, ADENOIDECTOMY, BILATERAL MYRINGOTOMY AND TUBES Bilateral     Current Outpatient Medications  Medication Sig Dispense Refill   blood glucose meter kit and supplies KIT Dispense based on patient and insurance preference. Use up to four times daily as directed. 1 each 0   fluticasone (FLONASE ALLERGY RELIEF) 50 MCG/ACT nasal spray Place 2 sprays into both nostrils daily. 16 g 2   fluticasone (FLOVENT HFA) 44 MCG/ACT inhaler Inhale 2 puffs into the lungs 2 (two) times daily. Rinse mouth after use. 1 each 0   glipiZIDE (GLUCOTROL XL) 2.5 MG 24 hr tablet Take 1 tablet (2.5 mg total) by mouth daily with breakfast. 30 tablet 2   ondansetron (ZOFRAN) 8 MG tablet Take 1 tablet (8 mg total) by mouth every 8 (eight) hours as needed for nausea or  vomiting. 20 tablet 0   pravastatin (PRAVACHOL) 20 MG tablet Take 1 tablet (20 mg total) by mouth every other day. 30 tablet 2   tirzepatide (MOUNJARO) 5 MG/0.5ML Pen Inject 5 mg into the skin once a week. 6 mL 2   No current facility-administered medications for this visit.     ALLERGIES: Metformin and related  Family History  Problem Relation Age of Onset   Breast cancer Mother 84   Diabetes Mother    Stroke Mother    COPD Mother    Hypertension Mother    Hypertension Father    Cancer Father    Breast cancer Sister    Breast cancer Maternal Grandmother     Social History   Socioeconomic History   Marital status: Married    Spouse name: Not on file   Number of children: 1   Years of education: Not on file   Highest education level: Not on file  Occupational History   Not on file  Tobacco Use   Smoking status: Former    Packs/day: 1.00    Years: 30.00    Total pack years: 30.00    Types: Cigarettes    Quit date: 01/19/2021    Years since quitting: 1.2   Smokeless tobacco: Never  Vaping Use   Vaping Use: Every day  Substance and Sexual Activity   Alcohol use: Not Currently   Drug use: Not  Currently   Sexual activity: Yes    Birth control/protection: None  Other Topics Concern   Not on file  Social History Narrative   Not on file   Social Determinants of Health   Financial Resource Strain: Not on file  Food Insecurity: Not on file  Transportation Needs: Not on file  Physical Activity: Not on file  Stress: Not on file  Social Connections: Not on file  Intimate Partner Violence: Not on file    Review of Systems  All other systems reviewed and are negative.   PHYSICAL EXAMINATION:    BP 132/84   Pulse 82   Wt 239 lb (108.4 kg)   LMP 03/25/2022   SpO2 100%   BMI 42.88 kg/m     General appearance: alert, cooperative and appears stated age  Pelvic: External genitalia:  no lesions              Urethra:  normal appearing urethra with no masses,  tenderness or lesions              Bartholins and Skenes: normal                 Vagina: normal appearing vagina with normal color and discharge, no lesions. Small grade 2 cystocele and rectocele. No significant uterine prolapse. She has significant leakage of urine with valsalva. Examined supine and standing with and without valsalva.               Cervix: no lesions              Bimanual Exam:  Uterus:  normal size, contour, position, consistency, mobility, non-tender              Adnexa: no mass, fullness, tenderness                Chaperone was present for exam.  1. Female genital prolapse, unspecified type Mild cystocele and rectocele  2. Mixed stress and urge urinary incontinence Severe GSI Will refer to Dr Wannetta Sender in [place]  She is having a leep on Friday, procedure reviewed, consent signed, 1 dose of ativan sent.

## 2022-05-01 ENCOUNTER — Encounter: Payer: Self-pay | Admitting: Obstetrics and Gynecology

## 2022-05-01 MED ORDER — LORAZEPAM 1 MG PO TABS: ORAL_TABLET | ORAL | 0 refills | Status: DC

## 2022-05-02 ENCOUNTER — Ambulatory Visit (INDEPENDENT_AMBULATORY_CARE_PROVIDER_SITE_OTHER): Payer: BC Managed Care – PPO | Admitting: Obstetrics and Gynecology

## 2022-05-02 ENCOUNTER — Encounter: Payer: Self-pay | Admitting: Obstetrics and Gynecology

## 2022-05-02 VITALS — BP 110/64 | HR 72 | Wt 239.0 lb

## 2022-05-02 DIAGNOSIS — Z01812 Encounter for preprocedural laboratory examination: Secondary | ICD-10-CM | POA: Diagnosis not present

## 2022-05-02 DIAGNOSIS — N870 Mild cervical dysplasia: Secondary | ICD-10-CM | POA: Diagnosis not present

## 2022-05-02 LAB — PREGNANCY, URINE: Preg Test, Ur: NEGATIVE

## 2022-05-02 MED ORDER — LORAZEPAM 1 MG PO TABS: ORAL_TABLET | ORAL | 0 refills | Status: DC

## 2022-05-02 NOTE — Progress Notes (Signed)
GYNECOLOGY  VISIT   HPI: 47 y.o.   Married White or Caucasian Unavailable  female   No obstetric history on file. with Patient's last menstrual period was 03/25/2022.   here for LEEP. Patient was sexually active in the last two weeks. She thinks last Friday 04/25/22, they don't use any form of contraception.   GYNECOLOGIC HISTORY: Patient's last menstrual period was 03/25/2022. Contraception:none  Menopausal hormone therapy: none         OB History   No obstetric history on file.        Patient Active Problem List   Diagnosis Date Noted   Allergic rhinitis 02/15/2022   Encounter for well woman exam 02/11/2022   Morbid obesity ([place]) 11/18/2021   Type 2 diabetes mellitus with hyperglycemia, without long-term current use of insulin ([place]) 11/18/2021   RUQ pain 11/18/2021   Hemangioma of liver 11/18/2021   Mixed hyperlipidemia 11/18/2021    Past Medical History:  Diagnosis Date   Allergy    Chronic bronchitis ([place])    Diabetes mellitus without complication (HCC)    GERD (gastroesophageal reflux disease)     Past Surgical History:  Procedure Laterality Date   TONSILECTOMY, ADENOIDECTOMY, BILATERAL MYRINGOTOMY AND TUBES Bilateral     Current Outpatient Medications  Medication Sig Dispense Refill   blood glucose meter kit and supplies KIT Dispense based on patient and insurance preference. Use up to four times daily as directed. 1 each 0   fluticasone (FLONASE ALLERGY RELIEF) 50 MCG/ACT nasal spray Place 2 sprays into both nostrils daily. 16 g 2   fluticasone (FLOVENT HFA) 44 MCG/ACT inhaler Inhale 2 puffs into the lungs 2 (two) times daily. Rinse mouth after use. 1 each 0   glipiZIDE (GLUCOTROL XL) 2.5 MG 24 hr tablet Take 1 tablet (2.5 mg total) by mouth daily with breakfast. 30 tablet 2   LORazepam (ATIVAN) 1 MG tablet Take one tablet po 1 hour prior to your procedure. 1 tablet 0   ondansetron (ZOFRAN) 8 MG tablet Take 1 tablet (8 mg total) by mouth every 8 (eight) hours as  needed for nausea or vomiting. 20 tablet 0   pravastatin (PRAVACHOL) 20 MG tablet Take 1 tablet (20 mg total) by mouth every other day. 30 tablet 2   tirzepatide (MOUNJARO) 5 MG/0.5ML Pen Inject 5 mg into the skin once a week. 6 mL 2   No current facility-administered medications for this visit.     ALLERGIES: Metformin and related  Family History  Problem Relation Age of Onset   Breast cancer Mother 58   Diabetes Mother    Stroke Mother    COPD Mother    Hypertension Mother    Hypertension Father    Cancer Father    Breast cancer Sister    Breast cancer Maternal Grandmother     Social History   Socioeconomic History   Marital status: Married    Spouse name: Not on file   Number of children: 1   Years of education: Not on file   Highest education level: Not on file  Occupational History   Not on file  Tobacco Use   Smoking status: Former    Packs/day: 1.00    Years: 30.00    Total pack years: 30.00    Types: Cigarettes    Quit date: 01/19/2021    Years since quitting: 1.2   Smokeless tobacco: Never  Vaping Use   Vaping Use: Every day  Substance and Sexual Activity   Alcohol use:  Not Currently   Drug use: Not Currently   Sexual activity: Yes    Birth control/protection: None  Other Topics Concern   Not on file  Social History Narrative   Not on file   Social Determinants of Health   Financial Resource Strain: Not on file  Food Insecurity: Not on file  Transportation Needs: Not on file  Physical Activity: Not on file  Stress: Not on file  Social Connections: Not on file  Intimate Partner Violence: Not on file    Review of Systems  All other systems reviewed and are negative.   PHYSICAL EXAMINATION:    LMP 03/25/2022     General appearance: alert, cooperative and appears stated age  46. Dysplasia of cervix, low grade (CIN 1) H/O at least CIN I, she was given the choice of close f/u or treatment, desires treatment. -She had unprotected intercourse  within the last week -Will abstain from intercourse or use condoms. -Will return for leep after 2 weeks without unprotected IC -Will give one more dose of ativan for pre-procedure anxiety.   2. Pre-procedure lab exam - Pregnancy, urine

## 2022-05-06 ENCOUNTER — Ambulatory Visit: Payer: BC Managed Care – PPO | Admitting: Physician Assistant

## 2022-05-06 ENCOUNTER — Encounter: Payer: Self-pay | Admitting: Physician Assistant

## 2022-05-06 VITALS — BP 136/90 | HR 89 | Temp 98.2°F | Ht 62.6 in | Wt 237.4 lb

## 2022-05-06 DIAGNOSIS — R509 Fever, unspecified: Secondary | ICD-10-CM

## 2022-05-06 DIAGNOSIS — R5383 Other fatigue: Secondary | ICD-10-CM

## 2022-05-06 DIAGNOSIS — Z1211 Encounter for screening for malignant neoplasm of colon: Secondary | ICD-10-CM | POA: Diagnosis not present

## 2022-05-06 LAB — POC COVID19 BINAXNOW: SARS Coronavirus 2 Ag: NEGATIVE

## 2022-05-06 LAB — POCT INFLUENZA A/B
Influenza A, POC: NEGATIVE
Influenza B, POC: NEGATIVE

## 2022-05-06 MED ORDER — PRAVASTATIN SODIUM 20 MG PO TABS: 20.0000 mg | ORAL_TABLET | ORAL | 2 refills | Status: DC

## 2022-05-06 MED ORDER — TIRZEPATIDE 5 MG/0.5ML ~~LOC~~ SOAJ: 5.0000 mg | SUBCUTANEOUS | 2 refills | Status: DC

## 2022-05-06 MED ORDER — FLUTICASONE PROPIONATE HFA 44 MCG/ACT IN AERO: 2.0000 | INHALATION_SPRAY | Freq: Two times a day (BID) | RESPIRATORY_TRACT | 0 refills | Status: DC

## 2022-05-06 MED ORDER — GLIPIZIDE ER 2.5 MG PO TB24: 2.5000 mg | ORAL_TABLET | Freq: Every day | ORAL | 2 refills | Status: DC

## 2022-05-06 NOTE — Progress Notes (Signed)
Subjective:    Patient ID: Tanya Zamora, female    DOB: Jul 16, 1975, 47 y.o.   MRN: [id number]  Chief Complaint  Patient presents with   Covid Exposure    Pt was exposed at work to a patient/resident with Covid and now feeling poorly since yesterday. Pt c/o cough, fatigue, sore throat, extreme headache not relieving with Excedrin Migraine; fever, chills, body aches.     HPI Patient is in today for acute sick symptoms.  Chief complaint: Not feeling well in general Symptom onset: Yesterday morning Pertinent positives: Headache, very fatigue, "hot" all over, some cough, some vomiting yesterday morning Pertinent negatives: Chest pain,  Treatments tried: Excedrin Migraine not helping Vaccine status: Not UTD with COVID-19 or flu vaccines  Sick exposure: Two residents at nursing home with COVID-19    Past Medical History:  Diagnosis Date   Allergy    Chronic bronchitis (HCC)    Diabetes mellitus without complication (HCC)    GERD (gastroesophageal reflux disease)     Past Surgical History:  Procedure Laterality Date   TONSILECTOMY, ADENOIDECTOMY, BILATERAL MYRINGOTOMY AND TUBES Bilateral     Family History  Problem Relation Age of Onset   Breast cancer Mother 24   Diabetes Mother    Stroke Mother    COPD Mother    Hypertension Mother    Hypertension Father    Cancer Father    Breast cancer Sister    Breast cancer Maternal Grandmother     Social History   Tobacco Use   Smoking status: Former    Packs/day: 1.00    Years: 30.00    Total pack years: 30.00    Types: Cigarettes    Quit date: 01/19/2021    Years since quitting: 1.2   Smokeless tobacco: Never  Vaping Use   Vaping Use: Every day  Substance Use Topics   Alcohol use: Not Currently   Drug use: Not Currently     Allergies  Allergen Reactions   Metformin And Related Diarrhea and Nausea And Vomiting    Review of Systems NEGATIVE UNLESS OTHERWISE INDICATED IN HPI      Objective:     BP (!)  136/90 (BP Location: Left Arm)   Pulse 89   Temp 98.2 F (36.8 C) (Oral)   Ht 5' 2.6" (1.59 m)   Wt 237 lb 6.4 oz (107.7 kg)   LMP 03/25/2022   SpO2 96%   BMI 42.59 kg/m   Wt Readings from Last 3 Encounters:  05/06/22 237 lb 6.4 oz (107.7 kg)  05/02/22 239 lb (108.4 kg)  04/30/22 239 lb (108.4 kg)    BP Readings from Last 3 Encounters:  05/06/22 (!) 136/90  05/02/22 110/64  04/30/22 132/84     Physical Exam Vitals and nursing note reviewed.  Constitutional:      General: She is not in acute distress.    Appearance: Normal appearance. She is not ill-appearing.  HENT:     Head: Normocephalic.     Right Ear: Tympanic membrane, ear canal and external ear normal.     Left Ear: Tympanic membrane, ear canal and external ear normal.     Nose: No congestion.     Mouth/Throat:     Mouth: Mucous membranes are moist.     Pharynx: No oropharyngeal exudate or posterior oropharyngeal erythema.  Eyes:     Extraocular Movements: Extraocular movements intact.     Conjunctiva/sclera: Conjunctivae normal.     Pupils: Pupils are equal, round, and reactive to  light.  Cardiovascular:     Rate and Rhythm: Normal rate and regular rhythm.     Pulses: Normal pulses.     Heart sounds: Normal heart sounds. No murmur heard. Pulmonary:     Effort: Pulmonary effort is normal. No respiratory distress.     Breath sounds: Normal breath sounds. No wheezing.  Musculoskeletal:     Cervical back: Normal range of motion.  Skin:    General: Skin is warm.  Neurological:     Mental Status: She is alert and oriented to person, place, and time.  Psychiatric:        Mood and Affect: Mood normal.        Behavior: Behavior normal.        Assessment & Plan:  Other fatigue -     POCT Influenza A/B -     POC COVID-19 BinaxNow  Fever, unspecified fever cause -     POCT Influenza A/B -     POC COVID-19 BinaxNow  Colon cancer screening -     Cologuard  Other orders -     glipiZIDE ER; Take 1  tablet (2.5 mg total) by mouth daily with breakfast.  Dispense: 30 tablet; Refill: 2 -     Tirzepatide; Inject 5 mg into the skin once a week.  Dispense: 6 mL; Refill: 2 -     Pravastatin Sodium; Take 1 tablet (20 mg total) by mouth every other day.  Dispense: 30 tablet; Refill: 2 -     Fluticasone Propionate HFA; Inhale 2 puffs into the lungs 2 (two) times daily. Rinse mouth after use.  Dispense: 1 each; Refill: 0  POC flu and COVID-19 both negative in office.  Patient had recent exposure to COVID-19 while in the nursing home this week.  Symptoms are suspicious for possible COVID-19, might have been too early to test today.  Recommend that she stay at home today and tomorrow from work.  She might want to retest COVID-19 at home tomorrow.  She will let me know if this does end up turning positive, we can start on Paxlovid at that time.  She is going to rest and stay hydrated today.  She can take Tylenol or ibuprofen if she needs.  Return and ED precautions discussed with patient.    Return if symptoms worsen or fail to improve.    Lainie Daubert M Sharone Almond, PA-C

## 2022-05-15 ENCOUNTER — Other Ambulatory Visit (HOSPITAL_COMMUNITY)
Admission: RE | Admit: 2022-05-15 | Discharge: 2022-05-15 | Disposition: A | Discharge status: Home / Self Care | Payer: BC Managed Care – PPO | Source: Ambulatory Visit | Attending: Obstetrics and Gynecology | Admitting: Obstetrics and Gynecology

## 2022-05-15 ENCOUNTER — Telehealth: Payer: Self-pay | Admitting: *Deleted

## 2022-05-15 ENCOUNTER — Encounter: Payer: Self-pay | Admitting: Obstetrics and Gynecology

## 2022-05-15 ENCOUNTER — Ambulatory Visit: Payer: BC Managed Care – PPO | Admitting: Obstetrics and Gynecology

## 2022-05-15 VITALS — BP 130/88 | HR 82 | Wt 239.0 lb

## 2022-05-15 DIAGNOSIS — N819 Female genital prolapse, unspecified: Secondary | ICD-10-CM

## 2022-05-15 DIAGNOSIS — N879 Dysplasia of cervix uteri, unspecified: Secondary | ICD-10-CM | POA: Insufficient documentation

## 2022-05-15 DIAGNOSIS — N3946 Mixed incontinence: Secondary | ICD-10-CM

## 2022-05-15 DIAGNOSIS — Z01812 Encounter for preprocedural laboratory examination: Secondary | ICD-10-CM | POA: Diagnosis not present

## 2022-05-15 LAB — PREGNANCY, URINE: Preg Test, Ur: NEGATIVE

## 2022-05-15 NOTE — Progress Notes (Signed)
GYNECOLOGY  VISIT   HPI: 47 y.o.   Married White or Caucasian Unavailable  female   No obstetric history on file. with No LMP recorded.   here for LEEP.  She has not been sexually active in the last 2 weeks.   Pap with ASCUS/+HPV Colpo biopsy with at least low grade dysplasia  GYNECOLOGIC HISTORY: No LMP recorded. Contraception:none  Menopausal hormone therapy: none         OB History   No obstetric history on file.        Patient Active Problem List   Diagnosis Date Noted   Allergic rhinitis 02/15/2022   Encounter for well woman exam 02/11/2022   Morbid obesity ([place]) 11/18/2021   Type 2 diabetes mellitus with hyperglycemia, without long-term current use of insulin ([place]) 11/18/2021   RUQ pain 11/18/2021   Hemangioma of liver 11/18/2021   Mixed hyperlipidemia 11/18/2021    Past Medical History:  Diagnosis Date   Allergy    Chronic bronchitis ([place])    Diabetes mellitus without complication (HCC)    GERD (gastroesophageal reflux disease)     Past Surgical History:  Procedure Laterality Date   TONSILECTOMY, ADENOIDECTOMY, BILATERAL MYRINGOTOMY AND TUBES Bilateral     Current Outpatient Medications  Medication Sig Dispense Refill   blood glucose meter kit and supplies KIT Dispense based on patient and insurance preference. Use up to four times daily as directed. 1 each 0   fluticasone (FLONASE ALLERGY RELIEF) 50 MCG/ACT nasal spray Place 2 sprays into both nostrils daily. 16 g 2   fluticasone (FLOVENT HFA) 44 MCG/ACT inhaler Inhale 2 puffs into the lungs 2 (two) times daily. Rinse mouth after use. 1 each 0   glipiZIDE (GLUCOTROL XL) 2.5 MG 24 hr tablet Take 1 tablet (2.5 mg total) by mouth daily with breakfast. 30 tablet 2   LORazepam (ATIVAN) 1 MG tablet Take one tablet po 1 hour prior to your procedure. (Patient not taking: Reported on 05/06/2022) 1 tablet 0   ondansetron (ZOFRAN) 8 MG tablet Take 1 tablet (8 mg total) by mouth every 8 (eight) hours as needed for nausea  or vomiting. (Patient not taking: Reported on 05/06/2022) 20 tablet 0   pravastatin (PRAVACHOL) 20 MG tablet Take 1 tablet (20 mg total) by mouth every other day. 30 tablet 2   tirzepatide (MOUNJARO) 5 MG/0.5ML Pen Inject 5 mg into the skin once a week. 6 mL 2   No current facility-administered medications for this visit.     ALLERGIES: Metformin and related  Family History  Problem Relation Age of Onset   Breast cancer Mother 55   Diabetes Mother    Stroke Mother    COPD Mother    Hypertension Mother    Hypertension Father    Cancer Father    Breast cancer Sister    Breast cancer Maternal Grandmother     Social History   Socioeconomic History   Marital status: Married    Spouse name: Not on file   Number of children: 1   Years of education: Not on file   Highest education level: Not on file  Occupational History   Not on file  Tobacco Use   Smoking status: Former    Packs/day: 1.00    Years: 30.00    Total pack years: 30.00    Types: Cigarettes    Quit date: 01/19/2021    Years since quitting: 1.3   Smokeless tobacco: Never  Vaping Use   Vaping Use: Every day  Substance and Sexual Activity   Alcohol use: Not Currently   Drug use: Not Currently   Sexual activity: Yes    Birth control/protection: None  Other Topics Concern   Not on file  Social History Narrative   Not on file   Social Determinants of Health   Financial Resource Strain: Not on file  Food Insecurity: Not on file  Transportation Needs: Not on file  Physical Activity: Not on file  Stress: Not on file  Social Connections: Not on file  Intimate Partner Violence: Not on file    Review of Systems  All other systems reviewed and are negative.   PHYSICAL EXAMINATION:    There were no vitals taken for this visit.    General appearance: alert, cooperative and appears stated age   Pelvic: External genitalia:  no lesions              Urethra:  normal appearing urethra with no masses,  tenderness or lesions              Bartholins and Skenes: normal                 Vagina: normal appearing vagina with normal color and discharge, no lesions              Cervix: no lesions  Procedure: The patient was counseled as to the risks of the procedure, including: infection, bleeding, future pregnancy risks and cervical stenosis. A consent form was signed.  Under colposcopic guidance, Lugols solution was placed on the cervix and a paracervical block was injected using 1% lidocaine with epinephrine. Under colposcopic guidance, the 2 x 0.8 cm loop was used to remove a portion of the ectocervix taking care to get the entire transformation zone.  A second 1 x 1 cm loop was used to remove a portion of the endocervix. The settings were 55 cut, 66 coag with a blend of 1.  An ECC was performed. The cautery ball was then used to cauterize the base of the biopsy site and monsels were placed. The patient tolerated the procedure well.   Chaperone was present for exam.  1. Cervical dysplasia - Surgical pathology( [place])  2. Pre-procedure lab exam - Pregnancy, urine

## 2022-05-15 NOTE — Telephone Encounter (Signed)
Spoke with patient.  Patient requesting another provider. Referral placed to Dr. Rockey Situ  [facility] Address [id number] [place],  [address] [phone] Tanya Zamora)  Advised their office will contact her directly to schedule. Patient verbalizes understanding and is agreeable.   Referral placed.   Routing to [place] to f/u on referral.

## 2022-05-15 NOTE — Telephone Encounter (Signed)
-----   Message from Salvadore Dom, MD sent at 05/15/2022  3:41 PM EST ----- [facility], Recently I referred this patient to Dr Wannetta Sender for a surgery consultation. She hasn't heard from anyone, but isn't sure she didn't miss the call. She needs to see a urogynecologist somewhere for surgery, can you please help. Thanks, Sharee Pimple

## 2022-05-15 NOTE — Patient Instructions (Signed)

## 2022-05-17 DIAGNOSIS — Z1211 Encounter for screening for malignant neoplasm of colon: Secondary | ICD-10-CM | POA: Diagnosis not present

## 2022-05-21 LAB — SURGICAL PATHOLOGY

## 2022-05-23 NOTE — Telephone Encounter (Signed)
Patient is scheduled for 07/22/22.

## 2022-05-27 LAB — COLOGUARD: COLOGUARD: NEGATIVE

## 2022-06-10 ENCOUNTER — Encounter: Payer: Self-pay | Admitting: Physician Assistant

## 2022-06-10 NOTE — Telephone Encounter (Signed)
Patient scheduled for VV with PCP for tomorrow; double booked and ok per PCP for that time slot

## 2022-06-10 NOTE — Telephone Encounter (Signed)
Please see pt msg and advise if visit is necessary per patient request

## 2022-06-11 ENCOUNTER — Encounter: Payer: Self-pay | Admitting: Physician Assistant

## 2022-06-11 ENCOUNTER — Other Ambulatory Visit: Payer: Self-pay | Admitting: Physician Assistant

## 2022-06-11 ENCOUNTER — Telehealth (INDEPENDENT_AMBULATORY_CARE_PROVIDER_SITE_OTHER): Payer: BC Managed Care – PPO | Admitting: Physician Assistant

## 2022-06-11 VITALS — Ht 62.6 in | Wt 239.0 lb

## 2022-06-11 DIAGNOSIS — R928 Other abnormal and inconclusive findings on diagnostic imaging of breast: Secondary | ICD-10-CM

## 2022-06-11 DIAGNOSIS — N61 Mastitis without abscess: Secondary | ICD-10-CM

## 2022-06-11 MED ORDER — CLINDAMYCIN HCL 300 MG PO CAPS: 300.0000 mg | ORAL_CAPSULE | Freq: Three times a day (TID) | ORAL | 0 refills | Status: AC

## 2022-06-11 NOTE — Progress Notes (Signed)
   Virtual Visit via Video Note  I connected with  Tanya Zamora  on 06/11/22 at  8:30 AM EDT by a video enabled telemedicine application and verified that I am speaking with the correct person using two identifiers.  Location: Patient: home Provider: Therapist, music at [place] present: Patient and myself   I discussed the limitations of evaluation and management by telemedicine and the availability of in person appointments. The patient expressed understanding and agreed to proceed.   History of Present Illness:  47 yo female with T2DM presenting for concerns of skin infection.  Area under left breast - started two weeks ago as a blister. Now red around it, with green / oozy pus. No fever or chills. It is warm around the outside of the area.  She has old clindamycin tablets taking BID, not sure of the dosage, seems to be helping some.    Observations/Objective:   Gen: Awake, alert, no acute distress Resp: Breathing is even and non-labored Psych: calm/pleasant demeanor Neuro: Alert and Oriented x 3, + facial symmetry, speech is clear. Skin: see photo below of area underneath left breast that is erythematous with central scabbing region     Assessment and Plan:  1. Cellulitis of left breast Pt had old prescription of clindamycin at home that she has been taking the past two days, does seem to be helping symptoms. Will Rx 3 more days of Clindamycin 300 mg TID. Keep area clean and dry. Monitor. Recheck if worse redness, pain, pus, fever, etc. Pt aware of risks vs benefits and possible adverse reactions.    Follow Up Instructions:    I discussed the assessment and treatment plan with the patient. The patient was provided an opportunity to ask questions and all were answered. The patient agreed with the plan and demonstrated an understanding of the instructions.   The patient was advised to call back or seek an in-person evaluation if the symptoms worsen  or if the condition fails to improve as anticipated.  Diasia Henken M Odessa Nishi, PA-C

## 2022-06-12 ENCOUNTER — Ambulatory Visit (INDEPENDENT_AMBULATORY_CARE_PROVIDER_SITE_OTHER): Payer: BC Managed Care – PPO | Admitting: Obstetrics and Gynecology

## 2022-06-12 ENCOUNTER — Encounter: Payer: Self-pay | Admitting: Obstetrics and Gynecology

## 2022-06-12 VITALS — BP 122/80 | HR 98 | Resp 20

## 2022-06-12 DIAGNOSIS — Z3009 Encounter for other general counseling and advice on contraception: Secondary | ICD-10-CM | POA: Diagnosis not present

## 2022-06-12 DIAGNOSIS — Z9889 Other specified postprocedural states: Secondary | ICD-10-CM

## 2022-06-12 MED ORDER — NORETHINDRONE 0.35 MG PO TABS: 1.0000 | ORAL_TABLET | Freq: Every day | ORAL | 11 refills | Status: DC

## 2022-06-12 NOTE — Progress Notes (Signed)
GYNECOLOGY  VISIT   HPI: 47 y.o.   Married White or Caucasian Unavailable  female   No obstetric history on file. with Patient's last menstrual period was 06/06/2022 (exact date).   here for 4 week leep f/u. Pathology with CIN I, +ectocervical margin, negative endocervical margin.   She is wanting to start birth control.   GYNECOLOGIC HISTORY: Patient's last menstrual period was 06/06/2022 (exact date). Contraception:None Menopausal hormone therapy: None        OB History   No obstetric history on file.        Patient Active Problem List   Diagnosis Date Noted   Allergic rhinitis 02/15/2022   Encounter for well woman exam 02/11/2022   Morbid obesity ([place]) 11/18/2021   Type 2 diabetes mellitus with hyperglycemia, without long-term current use of insulin ([place]) 11/18/2021   RUQ pain 11/18/2021   Hemangioma of liver 11/18/2021   Mixed hyperlipidemia 11/18/2021    Past Medical History:  Diagnosis Date   Allergy    Chronic bronchitis ([place])    Diabetes mellitus without complication (HCC)    GERD (gastroesophageal reflux disease)     Past Surgical History:  Procedure Laterality Date   TONSILECTOMY, ADENOIDECTOMY, BILATERAL MYRINGOTOMY AND TUBES Bilateral     Current Outpatient Medications  Medication Sig Dispense Refill   blood glucose meter kit and supplies KIT Dispense based on patient and insurance preference. Use up to four times daily as directed. 1 each 0   clindamycin (CLEOCIN) 300 MG capsule Take 1 capsule (300 mg total) by mouth 3 (three) times daily for 3 days. 9 capsule 0   fluticasone (FLONASE ALLERGY RELIEF) 50 MCG/ACT nasal spray Place 2 sprays into both nostrils daily. 16 g 2   fluticasone (FLOVENT HFA) 44 MCG/ACT inhaler Inhale 2 puffs into the lungs 2 (two) times daily. Rinse mouth after use. 1 each 0   glipiZIDE (GLUCOTROL XL) 2.5 MG 24 hr tablet Take 1 tablet (2.5 mg total) by mouth daily with breakfast. 30 tablet 2   LORazepam (ATIVAN) 1 MG tablet Take  one tablet po 1 hour prior to your procedure. 1 tablet 0   ondansetron (ZOFRAN) 8 MG tablet Take 1 tablet (8 mg total) by mouth every 8 (eight) hours as needed for nausea or vomiting. 20 tablet 0   pravastatin (PRAVACHOL) 20 MG tablet Take 1 tablet (20 mg total) by mouth every other day. 30 tablet 2   tirzepatide (MOUNJARO) 5 MG/0.5ML Pen Inject 5 mg into the skin once a week. 6 mL 2   No current facility-administered medications for this visit.     ALLERGIES: Metformin and related  Family History  Problem Relation Age of Onset   Breast cancer Mother 67   Diabetes Mother    Stroke Mother    COPD Mother    Hypertension Mother    Hypertension Father    Cancer Father    Breast cancer Sister    Breast cancer Maternal Grandmother     Social History   Socioeconomic History   Marital status: Married    Spouse name: Not on file   Number of children: 1   Years of education: Not on file   Highest education level: Not on file  Occupational History   Not on file  Tobacco Use   Smoking status: Former    Packs/day: 1.00    Years: 30.00    Additional pack years: 0.00    Total pack years: 30.00    Types: Cigarettes  Quit date: 01/19/2021    Years since quitting: 1.3   Smokeless tobacco: Never  Vaping Use   Vaping Use: Every day  Substance and Sexual Activity   Alcohol use: Not Currently   Drug use: Not Currently   Sexual activity: Yes    Birth control/protection: None  Other Topics Concern   Not on file  Social History Narrative   Not on file   Social Determinants of Health   Financial Resource Strain: Not on file  Food Insecurity: Not on file  Transportation Needs: Not on file  Physical Activity: Not on file  Stress: Not on file  Social Connections: Not on file  Intimate Partner Violence: Not on file    Review of Systems  All other systems reviewed and are negative.   PHYSICAL EXAMINATION:    LMP 06/06/2022 (Exact Date)     General appearance: alert,  cooperative and appears stated age  Pelvic: External genitalia:  no lesions              Urethra:  normal appearing urethra with no masses, tenderness or lesions              Bartholins and Skenes: normal                 Vagina: normal appearing vagina with normal color and discharge, no lesions              Cervix: no lesions and healing well from the leep, slightly friable, treated with silver nitrate                Chaperone was present for exam.  1. H/O LEEP Healing well Needs a pap with hpv in 5 months  2. General counseling and advice on female contraception She has multiple medical issues. No contraindications to POP, will start.  - norethindrone (ORTHO MICRONOR) 0.35 MG tablet; Take 1 tablet (0.35 mg total) by mouth daily.  Dispense: 28 tablet; Refill: 11   Addendum: She is on Tirzepatidefor, will recommend that she she use a backup method of contraception for 4 weeks after initiation of tirzepatide and for 4 weeks after each dose escalation of tirzepatide. The patient has been notified.

## 2022-06-25 ENCOUNTER — Telehealth (INDEPENDENT_AMBULATORY_CARE_PROVIDER_SITE_OTHER): Payer: BC Managed Care – PPO | Admitting: Physician Assistant

## 2022-06-25 VITALS — Ht 62.6 in | Wt 239.0 lb

## 2022-06-25 DIAGNOSIS — E782 Mixed hyperlipidemia: Secondary | ICD-10-CM | POA: Diagnosis not present

## 2022-06-25 DIAGNOSIS — E1165 Type 2 diabetes mellitus with hyperglycemia: Secondary | ICD-10-CM | POA: Diagnosis not present

## 2022-06-25 DIAGNOSIS — M62830 Muscle spasm of back: Secondary | ICD-10-CM

## 2022-06-25 MED ORDER — BACLOFEN 10 MG PO TABS: 10.0000 mg | ORAL_TABLET | Freq: Three times a day (TID) | ORAL | 0 refills | Status: DC

## 2022-06-25 NOTE — Progress Notes (Signed)
   Virtual Visit via Video Note  I connected with  Tanya Zamora  on 06/25/22 at  8:00 AM EDT by a video enabled telemedicine application and verified that I am speaking with the correct person using two identifiers.  Location: Patient: home Provider: Therapist, music at [place] present: Patient and myself   I discussed the limitations of evaluation and management by telemedicine and the availability of in person appointments. The patient expressed understanding and agreed to proceed.   History of Present Illness:  47 year old female presents for virtual visit to discuss type 2 diabetes, hyperlipidemia.   Patient reports that she has been doing well with Mounjaro 5 mg once weekly.  She is feeling more energized and thinking better overall.  She will occasionally check her glucose readings while at work and states before the [facility] she was getting readings in the 200s to 400s range, now 150-170s. She has also been doing well with Pravachol 20 mg every other day.  Denies any side effects.  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  She has been taking over-the-counter NSAIDs without much relief.    Observations/Objective:   Gen: Awake, alert, no acute distress Resp: Breathing is even and non-labored Psych: calm/pleasant demeanor Neuro: Alert and Oriented x 3, + facial symmetry, speech is clear.   Assessment and Plan:  1. Type 2 diabetes mellitus with hyperglycemia, without long-term current use of insulin Glucose readings at work showing improvement.  Plan to update labs.  She will get those done at our office this week.  She did not tolerate metformin.  She has been taking Mounjaro 5 mg once weekly.  She has also been taking glipizide XL 2.5 mg once daily.  Feels like her energy is better.  Plan to adjust therapy pending labs.  Continue lifestyle measures.  2. Mixed  hyperlipidemia Update lipid panel this week.  Continue Pravachol 20 mg every other day.  3. Spasm of muscle of lower back No red flag symptoms.  Continue NSAID use at home and conservative treatment.  Added baclofen 10 mg up to 3 times daily as needed for spasm. Pt aware of risks vs benefits and possible adverse reactions.  Recheck in office as needed.   Follow Up Instructions:    I discussed the assessment and treatment plan with the patient. The patient was provided an opportunity to ask questions and all were answered. The patient agreed with the plan and demonstrated an understanding of the instructions.   The patient was advised to call back or seek an in-person evaluation if the symptoms worsen or if the condition fails to improve as anticipated.  Tanya Zamora M Tanya Villavicencio, PA-C

## 2022-06-26 ENCOUNTER — Other Ambulatory Visit (INDEPENDENT_AMBULATORY_CARE_PROVIDER_SITE_OTHER): Payer: BC Managed Care – PPO

## 2022-06-26 DIAGNOSIS — E1165 Type 2 diabetes mellitus with hyperglycemia: Secondary | ICD-10-CM | POA: Diagnosis not present

## 2022-06-26 DIAGNOSIS — E782 Mixed hyperlipidemia: Secondary | ICD-10-CM

## 2022-06-26 LAB — COMPREHENSIVE METABOLIC PANEL
ALT: 15 U/L (ref 0–35)
AST: 13 U/L (ref 0–37)
Albumin: 3.9 g/dL (ref 3.5–5.2)
Alkaline Phosphatase: 101 U/L (ref 39–117)
BUN: 9 mg/dL (ref 6–23)
CO2: 28 mEq/L (ref 19–32)
Calcium: 8.8 mg/dL (ref 8.4–10.5)
Chloride: 103 mEq/L (ref 96–112)
Creatinine, Ser: 0.56 mg/dL (ref 0.40–1.20)
GFR: 110.3 mL/min (ref >60.00)
Glucose, Bld: 258 mg/dL — ABNORMAL HIGH (ref 70–99)
Potassium: 4 mEq/L (ref 3.5–5.1)
Sodium: 137 mEq/L (ref 135–145)
Total Bilirubin: 0.9 mg/dL (ref 0.2–1.2)
Total Protein: 6.3 g/dL (ref 6.0–8.3)

## 2022-06-26 LAB — LIPID PANEL
Cholesterol: 200 mg/dL (ref 0–200)
HDL: 40.2 mg/dL (ref >39.00)
LDL Cholesterol: 135 mg/dL — ABNORMAL HIGH (ref 0–99)
NonHDL: 159.77
Total CHOL/HDL Ratio: 5
Triglycerides: 124 mg/dL (ref 0.0–149.0)
VLDL: 24.8 mg/dL (ref 0.0–40.0)

## 2022-06-26 LAB — HEMOGLOBIN A1C: Hgb A1c MFr Bld: 10.1 % — ABNORMAL HIGH (ref 4.6–6.5)

## 2022-07-01 ENCOUNTER — Encounter: Payer: Self-pay | Admitting: Physician Assistant

## 2022-07-01 ENCOUNTER — Other Ambulatory Visit: Payer: Self-pay

## 2022-07-01 MED ORDER — PRAVASTATIN SODIUM 20 MG PO TABS: 20.0000 mg | ORAL_TABLET | ORAL | 2 refills | Status: DC

## 2022-07-01 NOTE — Telephone Encounter (Signed)
Please see pt msg and advise 

## 2022-07-28 ENCOUNTER — Ambulatory Visit
Admission: RE | Admit: 2022-07-28 | Discharge: 2022-07-28 | Disposition: A | Discharge status: Home / Self Care | Payer: BC Managed Care – PPO | Source: Ambulatory Visit | Attending: Physician Assistant | Admitting: Physician Assistant

## 2022-07-28 DIAGNOSIS — R928 Other abnormal and inconclusive findings on diagnostic imaging of breast: Secondary | ICD-10-CM

## 2022-07-28 DIAGNOSIS — Z803 Family history of malignant neoplasm of breast: Secondary | ICD-10-CM | POA: Diagnosis not present

## 2022-07-28 DIAGNOSIS — Z1231 Encounter for screening mammogram for malignant neoplasm of breast: Secondary | ICD-10-CM | POA: Diagnosis not present

## 2022-08-11 ENCOUNTER — Ambulatory Visit: Payer: BC Managed Care – PPO | Admitting: Physician Assistant

## 2022-08-11 VITALS — BP 120/76 | HR 94 | Temp 97.5°F | Ht 62.6 in | Wt 228.6 lb

## 2022-08-11 DIAGNOSIS — R197 Diarrhea, unspecified: Secondary | ICD-10-CM | POA: Diagnosis not present

## 2022-08-11 DIAGNOSIS — R1011 Right upper quadrant pain: Secondary | ICD-10-CM

## 2022-08-11 DIAGNOSIS — R112 Nausea with vomiting, unspecified: Secondary | ICD-10-CM | POA: Diagnosis not present

## 2022-08-11 LAB — COMPREHENSIVE METABOLIC PANEL
ALT: 10 U/L (ref 0–35)
AST: 10 U/L (ref 0–37)
Albumin: 3.8 g/dL (ref 3.5–5.2)
Alkaline Phosphatase: 104 U/L (ref 39–117)
BUN: 7 mg/dL (ref 6–23)
CO2: 26 mEq/L (ref 19–32)
Calcium: 9.4 mg/dL (ref 8.4–10.5)
Chloride: 101 mEq/L (ref 96–112)
Creatinine, Ser: 0.71 mg/dL (ref 0.40–1.20)
GFR: 102.67 mL/min (ref >60.00)
Glucose, Bld: 213 mg/dL — ABNORMAL HIGH (ref 70–99)
Potassium: 3.5 mEq/L (ref 3.5–5.1)
Sodium: 137 mEq/L (ref 135–145)
Total Bilirubin: 1 mg/dL (ref 0.2–1.2)
Total Protein: 6.7 g/dL (ref 6.0–8.3)

## 2022-08-11 LAB — CBC WITH DIFFERENTIAL/PLATELET
Basophils Absolute: 0 10*3/uL (ref 0.0–0.1)
Basophils Relative: 0.1 % (ref 0.0–3.0)
Eosinophils Absolute: 0.3 10*3/uL (ref 0.0–0.7)
Eosinophils Relative: 3.4 % (ref 0.0–5.0)
HCT: 47.1 % — ABNORMAL HIGH (ref 36.0–46.0)
Hemoglobin: 16.2 g/dL — ABNORMAL HIGH (ref 12.0–15.0)
Lymphocytes Relative: 30.1 % (ref 12.0–46.0)
Lymphs Abs: 3 10*3/uL (ref 0.7–4.0)
MCHC: 34.4 g/dL (ref 30.0–36.0)
MCV: 96.1 fl (ref 78.0–100.0)
Monocytes Absolute: 0.4 10*3/uL (ref 0.1–1.0)
Monocytes Relative: 4.1 % (ref 3.0–12.0)
Neutro Abs: 6.1 10*3/uL (ref 1.4–7.7)
Neutrophils Relative %: 62.3 % (ref 43.0–77.0)
Platelets: 332 10*3/uL (ref 150.0–400.0)
RBC: 4.89 Mil/uL (ref 3.87–5.11)
RDW: 13 % (ref 11.5–15.5)
WBC: 9.8 10*3/uL (ref 4.0–10.5)

## 2022-08-11 LAB — LIPASE: Lipase: 8 U/L — ABNORMAL LOW (ref 11.0–59.0)

## 2022-08-11 MED ORDER — ONDANSETRON 4 MG PO TBDP: 4.0000 mg | ORAL_TABLET | Freq: Three times a day (TID) | ORAL | 0 refills | Status: DC | PRN

## 2022-08-11 NOTE — Progress Notes (Signed)
Subjective:    Patient ID: Tanya Zamora, female    DOB: [birth year], 47 y.o.   MRN: [id number]  Chief Complaint  Patient presents with   Abdominal Pain    Pt c/o throwing up and diarrhea since Saturday; has recently went back to taking Mounjouro injection and thought it was coming from shot but since she hasn't taken in over 1 week still having symptoms, right side abdominal pain that radiates to back, pt also states may not be related but feeling dizzy today and a bad headache.    HPI Patient is in today for RUQ abdominal pain, n/v/d. US abdomen done on 11/19/21 was normal.   Last week, stomach bug going around at work; pt had nausea / diarrhea / vomiting, abd pain. Three days ago went out to eat, couldn't eat much due to nausea. Next day she couldn't eat til the evening, had some left-over steak; yesterday morning woke up with RUQ / periumbilical pain, n/v/d. Pain was stabbing and constant, thought she'd need to go to ED if it didn't resolve.  Having a lot of belching lately.   Phenergan and sleep helped it to resolve.  Pain this morning 3/10. Yesterday was 10/10.   Didn't take Mounjaro 5 mg last week due to sickness. Last dose was 08/01/22 & this was her starting back after a hiatus from the medicine.   Past Medical History:  Diagnosis Date   Allergy    Chronic bronchitis (HCC)    Diabetes mellitus without complication (HCC)    GERD (gastroesophageal reflux disease)     Past Surgical History:  Procedure Laterality Date   TONSILECTOMY, ADENOIDECTOMY, BILATERAL MYRINGOTOMY AND TUBES Bilateral     Family History  Problem Relation Age of Onset   Breast cancer Mother 26   Diabetes Mother    Stroke Mother    COPD Mother    Hypertension Mother    Hypertension Father    Cancer Father    Breast cancer Sister        30s   Breast cancer Maternal Grandmother     Social History   Tobacco Use   Smoking status: Former    Packs/day: 1.00    Years: 30.00    Additional  pack years: 0.00    Total pack years: 30.00    Types: Cigarettes    Quit date: 01/19/2021    Years since quitting: 1.5   Smokeless tobacco: Never  Vaping Use   Vaping Use: Every day  Substance Use Topics   Alcohol use: Not Currently   Drug use: Not Currently     Allergies  Allergen Reactions   Metformin And Related Diarrhea and Nausea And Vomiting    Review of Systems NEGATIVE UNLESS OTHERWISE INDICATED IN HPI      Objective:     BP 120/76 (BP Location: Left Arm)   Pulse 94   Temp (!) 97.5 F (36.4 C) (Temporal)   Ht 5' 2.6" (1.59 m)   Wt 228 lb 9.6 oz (103.7 kg)   LMP  (LMP Unknown)   SpO2 98%   BMI 41.01 kg/m   Wt Readings from Last 3 Encounters:  08/11/22 228 lb 9.6 oz (103.7 kg)  06/25/22 239 lb (108.4 kg)  06/11/22 239 lb (108.4 kg)    BP Readings from Last 3 Encounters:  08/11/22 120/76  06/12/22 122/80  05/15/22 130/88     Physical Exam Vitals and nursing note reviewed.  Constitutional:      General: She is not  in acute distress.    Appearance: Normal appearance. She is not ill-appearing.  HENT:     Head: Normocephalic and atraumatic.  Cardiovascular:     Rate and Rhythm: Normal rate and regular rhythm.     Pulses: Normal pulses.     Heart sounds: Normal heart sounds.  Pulmonary:     Effort: Pulmonary effort is normal.     Breath sounds: Normal breath sounds.  Abdominal:     General: Abdomen is flat. Bowel sounds are normal.     Palpations: Abdomen is soft.     Tenderness: There is generalized abdominal tenderness (minimal) and tenderness in the right upper quadrant and epigastric area. There is no right CVA tenderness, left CVA tenderness or guarding. Negative signs include Murphy's sign and McBurney's sign.  Skin:    General: Skin is warm and dry.  Neurological:     General: No focal deficit present.     Mental Status: She is alert.  Psychiatric:        Mood and Affect: Mood normal.        Assessment & Plan:  RUQ abdominal  pain -     CBC with Differential/Platelet -     Comprehensive metabolic panel -     Lipase  Nausea, vomiting, and diarrhea -     CBC with Differential/Platelet -     Comprehensive metabolic panel -     Lipase  Other orders -     Ondansetron; Take 1 tablet (4 mg total) by mouth every 8 (eight) hours as needed for nausea or vomiting.  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 Pt usually concerned about her gallbladder having issues as well; but [place] last year was normal. With pain improving significantly today, this is reassuring. No red flags on exam today; hold on imaging right now. Check labs and make sure no underlying issues there. Otherwise treat with zofran, fluids, bland diet; talk about restarting Mounjaro at 2.5 mg dose next week if overall improved.  ED if acutely worse / change in symptoms. Pt agreeable.    Return if symptoms worsen or fail to improve.     Shashank Kwasnik M Kayle Passarelli, PA-C

## 2022-08-12 ENCOUNTER — Emergency Department (HOSPITAL_COMMUNITY)
Admission: EM | Admit: 2022-08-12 | Discharge: 2022-08-13 | Disposition: A | Discharge status: Home / Self Care | Payer: BC Managed Care – PPO | Attending: Emergency Medicine | Admitting: Emergency Medicine

## 2022-08-12 ENCOUNTER — Other Ambulatory Visit: Payer: Self-pay

## 2022-08-12 DIAGNOSIS — R197 Diarrhea, unspecified: Secondary | ICD-10-CM | POA: Diagnosis not present

## 2022-08-12 DIAGNOSIS — R1011 Right upper quadrant pain: Secondary | ICD-10-CM | POA: Insufficient documentation

## 2022-08-12 DIAGNOSIS — R112 Nausea with vomiting, unspecified: Secondary | ICD-10-CM | POA: Diagnosis not present

## 2022-08-12 DIAGNOSIS — Z7984 Long term (current) use of oral hypoglycemic drugs: Secondary | ICD-10-CM | POA: Diagnosis not present

## 2022-08-12 DIAGNOSIS — R1013 Epigastric pain: Secondary | ICD-10-CM | POA: Insufficient documentation

## 2022-08-12 DIAGNOSIS — M25511 Pain in right shoulder: Secondary | ICD-10-CM | POA: Insufficient documentation

## 2022-08-12 DIAGNOSIS — R1031 Right lower quadrant pain: Secondary | ICD-10-CM | POA: Diagnosis not present

## 2022-08-12 LAB — CBC
HCT: 41 % (ref 36.0–46.0)
Hemoglobin: 14.6 g/dL (ref 12.0–15.0)
MCH: 33.1 pg (ref 26.0–34.0)
MCHC: 35.6 g/dL (ref 30.0–36.0)
MCV: 93 fL (ref 80.0–100.0)
Platelets: 286 10*3/uL (ref 150–400)
RBC: 4.41 MIL/uL (ref 3.87–5.11)
RDW: 12 % (ref 11.5–15.5)
WBC: 11.6 10*3/uL — ABNORMAL HIGH (ref 4.0–10.5)
nRBC: 0 % (ref 0.0–0.2)

## 2022-08-12 LAB — COMPREHENSIVE METABOLIC PANEL
ALT: 16 U/L (ref 0–44)
AST: 14 U/L — ABNORMAL LOW (ref 15–41)
Albumin: 3.5 g/dL (ref 3.5–5.0)
Alkaline Phosphatase: 97 U/L (ref 38–126)
Anion gap: 9 (ref 5–15)
BUN: 10 mg/dL (ref 6–20)
CO2: 24 mmol/L (ref 22–32)
Calcium: 8.8 mg/dL — ABNORMAL LOW (ref 8.9–10.3)
Chloride: 98 mmol/L (ref 98–111)
Creatinine, Ser: 0.74 mg/dL (ref 0.44–1.00)
GFR, Estimated: >60 mL/min (ref >60)
Glucose, Bld: 211 mg/dL — ABNORMAL HIGH (ref 70–99)
Potassium: 3 mmol/L — ABNORMAL LOW (ref 3.5–5.1)
Sodium: 131 mmol/L — ABNORMAL LOW (ref 135–145)
Total Bilirubin: 1.2 mg/dL (ref 0.3–1.2)
Total Protein: 7 g/dL (ref 6.5–8.1)

## 2022-08-12 LAB — LIPASE, BLOOD: Lipase: 24 U/L (ref 11–51)

## 2022-08-12 NOTE — ED Triage Notes (Signed)
N/v/d since Sunday.  RLQ pain. Pt states she feels this is more than a stomach bug.  Comes in for eval of symptoms.

## 2022-08-13 ENCOUNTER — Emergency Department (HOSPITAL_COMMUNITY): Payer: BC Managed Care – PPO

## 2022-08-13 DIAGNOSIS — R1031 Right lower quadrant pain: Secondary | ICD-10-CM | POA: Diagnosis not present

## 2022-08-13 LAB — URINALYSIS, ROUTINE W REFLEX MICROSCOPIC
Bilirubin Urine: NEGATIVE
Glucose, UA: 50 mg/dL — AB
Hgb urine dipstick: NEGATIVE
Ketones, ur: NEGATIVE mg/dL
Leukocytes,Ua: NEGATIVE
Nitrite: NEGATIVE
Protein, ur: 30 mg/dL — AB
Specific Gravity, Urine: 1.027 (ref 1.005–1.030)
pH: 5 (ref 5.0–8.0)

## 2022-08-13 LAB — POC URINE PREG, ED: Preg Test, Ur: NEGATIVE

## 2022-08-13 MED ORDER — PANTOPRAZOLE SODIUM 40 MG IV SOLR
40.0000 mg | Freq: Once | INTRAVENOUS | Status: AC
  Administered 2022-08-13: 40 mg via INTRAVENOUS
  Filled 2022-08-13: qty 10

## 2022-08-13 MED ORDER — PANTOPRAZOLE SODIUM 20 MG PO TBEC: 20.0000 mg | DELAYED_RELEASE_TABLET | Freq: Every day | ORAL | 0 refills | Status: DC

## 2022-08-13 MED ORDER — FAMOTIDINE 20 MG PO TABS: 20.0000 mg | ORAL_TABLET | Freq: Two times a day (BID) | ORAL | 0 refills | Status: DC

## 2022-08-13 MED ORDER — ONDANSETRON 4 MG PO TBDP: 4.0000 mg | ORAL_TABLET | Freq: Three times a day (TID) | ORAL | 0 refills | Status: DC | PRN

## 2022-08-13 MED ORDER — IOHEXOL 300 MG/ML  SOLN
100.0000 mL | Freq: Once | INTRAMUSCULAR | Status: AC | PRN
  Administered 2022-08-13: 100 mL via INTRAVENOUS

## 2022-08-13 MED ORDER — FAMOTIDINE 20 MG PO TABS
20.0000 mg | ORAL_TABLET | Freq: Once | ORAL | Status: AC
  Administered 2022-08-13: 20 mg via ORAL
  Filled 2022-08-13: qty 1

## 2022-08-13 MED ORDER — SUCRALFATE 1 GM/10ML PO SUSP: 1.0000 g | Freq: Three times a day (TID) | ORAL | 0 refills | Status: DC

## 2022-08-13 MED ORDER — ONDANSETRON HCL 4 MG/2ML IJ SOLN
4.0000 mg | Freq: Once | INTRAMUSCULAR | Status: AC
  Administered 2022-08-13: 4 mg via INTRAVENOUS
  Filled 2022-08-13: qty 2

## 2022-08-13 MED ORDER — LACTATED RINGERS IV BOLUS
1000.0000 mL | Freq: Once | INTRAVENOUS | Status: AC
  Administered 2022-08-13: 1000 mL via INTRAVENOUS

## 2022-08-13 NOTE — ED Provider Notes (Signed)
[place] EMERGENCY DEPARTMENT AT [facility] Provider Note   CSN: [id number] Arrival date & time: 08/12/22  2043     History Chief Complaint  Patient presents with   Abdominal Pain    Tanya Zamora is a 47 y.o. female.  47 year old female with a strong family history of cholelithiasis but no personal history of the same.  She presents today with 3 to 4 days of abdominal pain associated nausea vomiting and intermittent diarrhea.  States that the diarrhea does seem to be mustard colored versus her normal dark brown.  Has had some episodes of sweats usually associated with the throwing up.  Started after she ate some steak on Saturday night.  Felt a little bit better throughout the night and then woke up early Sunday morning throwing up again felt unwell all day Sunday and try to eat some potatoes later in the day and threw up again.  Then Monday morning same story and Tuesdays and thinks that she presents here for further evaluation.  She is concerned could be her gallbladder.  She states that the pain is sharp and stabbing is in epigastric right upper quadrant area.  Seems to radiate towards her back and she also has some pain in her right shoulder.  Has had 1 episode similar to this in the past but never got evaluated for it.   Abdominal Pain      Home Medications Prior to Admission medications   Medication Sig Start Date End Date Taking? Authorizing Provider  blood glucose meter kit and supplies KIT Dispense based on patient and insurance preference. Use up to four times daily as directed. 02/11/22   Allwardt, Crist Infante, PA-C  glipiZIDE (GLUCOTROL XL) 2.5 MG 24 hr tablet Take 1 tablet (2.5 mg total) by mouth daily with breakfast. 05/06/22   Allwardt, Alyssa M, PA-C  ondansetron (ZOFRAN-ODT) 4 MG disintegrating tablet Take 1 tablet (4 mg total) by mouth every 8 (eight) hours as needed for nausea or vomiting. 08/11/22   Allwardt, Alyssa M, PA-C  pravastatin (PRAVACHOL) 20 MG  tablet Take 1 tablet (20 mg total) by mouth every other day. 07/01/22   Allwardt, Crist Infante, PA-C  tirzepatide [facility]) 5 MG/0.5ML Pen Inject 5 mg into the skin once a week. 05/06/22   Allwardt, Crist Infante, PA-C      Allergies    Metformin and related    Review of Systems   Review of Systems  Gastrointestinal:  Positive for abdominal pain.    Physical Exam Updated Vital Signs BP 105/69 (BP Location: Left Arm)   Pulse 73   Temp 98.2 F (36.8 C) (Oral)   Resp 17   LMP  (LMP Unknown)   SpO2 100%  Physical Exam Vitals and nursing note reviewed.  Constitutional:      Appearance: She is well-developed.  HENT:     Head: Normocephalic and atraumatic.  Cardiovascular:     Rate and Rhythm: Normal rate and regular rhythm.  Pulmonary:     Effort: No respiratory distress.     Breath sounds: No stridor.  Abdominal:     General: There is no distension.     Tenderness: There is no abdominal tenderness (She points to her abdomen on the right when describing her pain but no particular tenderness in the upper or lower quadrants.  No rashes.).  Musculoskeletal:     Cervical back: Normal range of motion.  Neurological:     Mental Status: She is alert.  ED Results / Procedures / Treatments   Labs (all labs ordered are listed, but only abnormal results are displayed) Labs Reviewed  COMPREHENSIVE METABOLIC PANEL - Abnormal; Notable for the following components:      Result Value   Sodium 131 (*)    Potassium 3.0 (*)    Glucose, Bld 211 (*)    Calcium 8.8 (*)    AST 14 (*)    All other components within normal limits  CBC - Abnormal; Notable for the following components:   WBC 11.6 (*)    All other components within normal limits  URINALYSIS, ROUTINE W REFLEX MICROSCOPIC - Abnormal; Notable for the following components:   APPearance HAZY (*)    Glucose, UA 50 (*)    Protein, ur 30 (*)    Bacteria, UA RARE (*)    All other components within normal limits  LIPASE, BLOOD  POC URINE  PREG, ED    EKG None  Radiology CT ABDOMEN PELVIS W CONTRAST  Result Date: 08/13/2022 CLINICAL DATA:  Right lower quadrant abdominal pain. Nausea, vomiting, and diarrhea. EXAM: CT ABDOMEN AND PELVIS WITH CONTRAST TECHNIQUE: Multidetector CT imaging of the abdomen and pelvis was performed using the standard protocol following bolus administration of intravenous contrast. RADIATION DOSE REDUCTION: This exam was performed according to the departmental dose-optimization program which includes automated exposure control, adjustment of the mA and/or kV according to patient size and/or use of iterative reconstruction technique. CONTRAST:  OMNIPAQUE IOHEXOL 300 MG/ML  SOLN COMPARISON:  None Available. FINDINGS: Lower chest: No acute abnormality. Hepatobiliary: No focal liver abnormality is seen. No gallstones, gallbladder wall thickening, or biliary dilatation. Pancreas: Unremarkable. No pancreatic ductal dilatation or surrounding inflammatory changes. Spleen: Normal in size without focal abnormality. Adrenals/Urinary Tract: The adrenal glands are within normal limits. Kidneys enhance symmetrically. No renal calculus or hydronephrosis. The bladder is unremarkable. Stomach/Bowel: There is a small hiatal hernia. Stomach is within normal limits. Appendix appears normal. No evidence of bowel wall thickening, distention, or inflammatory changes. No free air or pneumatosis. Vascular/Lymphatic: Aortic atherosclerosis. No enlarged abdominal or pelvic lymph nodes. Reproductive: The uterus is within normal limits. There is a cyst in the left ovary measuring 3.7 cm. No follow-up imaging is recommended. No adnexal mass on the right. Other: No abdominopelvic ascites. A fat containing umbilical hernia is noted. Musculoskeletal: No acute osseous abnormality. IMPRESSION: No acute intra-abdominal process. Electronically Signed   By: Thornell Sartorius M.D.   On: 08/13/2022 01:54    Procedures Procedures    Medications  Ordered in ED Medications  lactated ringers bolus 1,000 mL (1,000 mLs Intravenous New Bag/Given 08/13/22 0128)  ondansetron (ZOFRAN) injection 4 mg (4 mg Intravenous Given 08/13/22 0128)  pantoprazole (PROTONIX) injection 40 mg (40 mg Intravenous Given 08/13/22 0127)  iohexol (OMNIPAQUE) 300 MG/ML solution 100 mL (100 mLs Intravenous Contrast Given 08/13/22 0137)    ED Course/ Medical Decision Making/ A&P                             Medical Decision Making Amount and/or Complexity of Data Reviewed Labs: ordered. Radiology: ordered.  Risk Prescription drug management.   Patient's labs reassuring.  No evidence of urinary tract infection, kidney stone, pyelonephritis, appendicitis or gallbladder issues.  Patient is feeling much better at this time after medications provided here.  Will continue to follow with her PCP if symptoms return.  Otherwise she will return here for new or worsening symptoms.  Final Clinical Impression(s) / ED Diagnoses Final diagnoses:  None    Rx / DC Orders ED Discharge Orders     None         Reynard Christoffersen, Barbara Cower, MD 08/13/22 [phone]

## 2022-08-19 ENCOUNTER — Encounter: Payer: Self-pay | Admitting: Gastroenterology

## 2022-08-19 ENCOUNTER — Ambulatory Visit: Payer: BC Managed Care – PPO | Admitting: Gastroenterology

## 2022-08-19 VITALS — BP 117/81 | HR 72 | Temp 98.2°F | Ht 62.0 in | Wt 246.6 lb

## 2022-08-19 DIAGNOSIS — Z1211 Encounter for screening for malignant neoplasm of colon: Secondary | ICD-10-CM | POA: Diagnosis not present

## 2022-08-19 DIAGNOSIS — R140 Abdominal distension (gaseous): Secondary | ICD-10-CM

## 2022-08-19 DIAGNOSIS — K5900 Constipation, unspecified: Secondary | ICD-10-CM | POA: Diagnosis not present

## 2022-08-19 DIAGNOSIS — R1010 Upper abdominal pain, unspecified: Secondary | ICD-10-CM | POA: Diagnosis not present

## 2022-08-19 DIAGNOSIS — R112 Nausea with vomiting, unspecified: Secondary | ICD-10-CM

## 2022-08-19 DIAGNOSIS — R197 Diarrhea, unspecified: Secondary | ICD-10-CM

## 2022-08-19 NOTE — Progress Notes (Signed)
GI Office Note    Referring Provider: Marily Memos, MD Primary Care Physician:  Allwardt, Crist Infante, PA-C  Primary Gastroenterologist: Hennie Duos. Marletta Lor, DO  Chief Complaint   Chief Complaint  Patient presents with   Abdominal Pain    Abdominal pain, burping, gassy. Diarrhea and vomiting.    History of Present Illness   Alieah Willmore is a 47 y.o. female presenting today at the request of Mesner, Barbara Cower, MD for epigastric/RUQ pain.   ED visit 08/12/2022 for epigastric pain.  She noted a strong family history of cholelithiasis.  Presented with 3-4 days of abdominal pain with associated nausea/vomiting, intermittent diarrhea.  Diarrhea noted to be yellow color.  Pain described as sharp and stabbing in the epigastric/RUQ region and at times radiates to her back and her shoulder.  Treated with pantoprazole IV, Zofran, and IV fluids.  Labs unremarkable other than mild hyponatremia and hypokalemia with sodium of 131 and potassium 3.0.  LFTs within normal limits.  WBC mildly elevated 11.6.  UA with regular bacteria.  Lipase normal.  Hemoglobin stable.  She underwent CT as noted below.  CT A/P 08/13/2022: -No acute abnormality -No gallstones, gallbladder wall thickening, or biliary dilation -Unremarkable pancreas and spleen -No stomach or bowel wall abnormality.  Per review of chart patient seen by PCP 08/11/2022 reporting nausea, vomiting, diarrhea, right upper quadrant pain.  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  She was also noted to be recently started on Mounjaro just prior to all the symptoms beginning as well.  No prior colonoscopy on file.  Today:  For a few weeks she has had some nausea/vomiting/diarrhea. That first Monday she had taken the mounjaro shot. She initially thought she had a stomach bug but then she got better and then a week later she woke up the same way (did not take mounjaro the Friday prior to that). Last dose of Mounjaro was May 10th.  She had increased the dose recently as well. Had a lot of belching and was having a lot of abdominal pain. She states she had 5-6 a day during that time. States now she is not pooping and having droopiness under her eyes and states she feels dehydrated. Has been drinking alot of water. Initially thought this was.   No melea or brbpr   Had lost 10 lbs initially on medication and not eating.   No longer having nausea or vomiting. Has not taken any medications given to her by the hospital.   She states she does not poop a lot but when she does its runnier.   States she did a Cologuard.   Dad had stomach cancer at age 16. No family history of colon cancer.    Current Outpatient Medications  Medication Sig Dispense Refill   glipiZIDE (GLUCOTROL XL) 2.5 MG 24 hr tablet Take 1 tablet (2.5 mg total) by mouth daily with breakfast. 30 tablet 2   ondansetron (ZOFRAN-ODT) 4 MG disintegrating tablet Take 1 tablet (4 mg total) by mouth every 8 (eight) hours as needed. 4mg  ODT q4 hours prn nausea/vomit 30 tablet 0   pravastatin (PRAVACHOL) 20 MG tablet Take 1 tablet (20 mg total) by mouth every other day. 30 tablet 2   blood glucose meter kit and supplies KIT Dispense based on patient and insurance preference. Use up to four times daily as directed. (Patient not taking: Reported on 08/19/2022) 1 each 0   famotidine (PEPCID) 20 MG tablet Take 1 tablet (20 mg  total) by mouth 2 (two) times daily. (Patient not taking: Reported on 08/19/2022) 10 tablet 0   pantoprazole (PROTONIX) 20 MG tablet Take 1 tablet (20 mg total) by mouth daily for 10 days. (Patient not taking: Reported on 08/19/2022) 10 tablet 0   sucralfate (CARAFATE) 1 GM/10ML suspension Take 10 mLs (1 g total) by mouth 4 (four) times daily -  with meals and at bedtime. (Patient not taking: Reported on 08/19/2022) 420 mL 0   tirzepatide (MOUNJARO) 5 MG/0.5ML Pen Inject 5 mg into the skin once a week. (Patient not taking: Reported on 08/19/2022) 6 mL 2    No current facility-administered medications for this visit.    Past Medical History:  Diagnosis Date   Allergy    Chronic bronchitis (HCC)    Diabetes mellitus without complication (HCC)    GERD (gastroesophageal reflux disease)     Past Surgical History:  Procedure Laterality Date   TONSILECTOMY, ADENOIDECTOMY, BILATERAL MYRINGOTOMY AND TUBES Bilateral     Family History  Problem Relation Age of Onset   Breast cancer Mother 13   Diabetes Mother    Stroke Mother    COPD Mother    Hypertension Mother    Hypertension Father    Cancer Father    Breast cancer Sister        30s   Breast cancer Maternal Grandmother     Allergies as of 08/19/2022 - Review Complete 08/19/2022  Allergen Reaction Noted   Metformin and related Diarrhea and Nausea And Vomiting 10/21/2021    Social History   Socioeconomic History   Marital status: Married    Spouse name: Not on file   Number of children: 1   Years of education: Not on file   Highest education level: Not on file  Occupational History   Not on file  Tobacco Use   Smoking status: Former    Packs/day: 1.00    Years: 30.00    Additional pack years: 0.00    Total pack years: 30.00    Types: Cigarettes    Quit date: 01/19/2021    Years since quitting: 1.5   Smokeless tobacco: Never  Vaping Use   Vaping Use: Every day  Substance and Sexual Activity   Alcohol use: Not Currently   Drug use: Not Currently   Sexual activity: Yes    Birth control/protection: None  Other Topics Concern   Not on file  Social History Narrative   Not on file   Social Determinants of Health   Financial Resource Strain: Not on file  Food Insecurity: Not on file  Transportation Needs: Not on file  Physical Activity: Not on file  Stress: Not on file  Social Connections: Not on file  Intimate Partner Violence: Not on file     Review of Systems   Gen: Denies any fever, chills, fatigue, weight loss, lack of appetite.  CV: Denies  chest pain, heart palpitations, peripheral edema, syncope.  Resp: Denies shortness of breath at rest or with exertion. Denies wheezing or cough.  GI: see HPI GU : Denies urinary burning, urinary frequency, urinary hesitancy MS: Denies joint pain, muscle weakness, cramps, or limitation of movement.  Derm: Denies rash, itching, dry skin Psych: Denies depression, anxiety, memory loss, and confusion Heme: Denies bruising, bleeding, and enlarged lymph nodes.   Physical Exam   BP 117/81 (BP Location: Right Arm, Patient Position: Sitting, Cuff Size: Large)   Pulse 72   Temp 98.2 F (36.8 C) (Temporal)   Ht 5'  2" (1.575 m)   Wt 246 lb 9.6 oz (111.9 kg)   LMP 07/23/2022 (Approximate)   SpO2 98%   BMI 45.10 kg/m   General:   Alert and oriented. Pleasant and cooperative. Well-nourished and well-developed.  Head:  Normocephalic and atraumatic. Eyes:  Without icterus, sclera clear and conjunctiva pink.  Ears:  Normal auditory acuity. Mouth:  No deformity or lesions, oral mucosa pink.  Lungs:  Clear to auscultation bilaterally. No wheezes, rales, or rhonchi. No distress.  Heart:  S1, S2 present without murmurs appreciated.  Abdomen:  +BS, soft, non-tender and non-distended. No HSM noted. No guarding or rebound. No masses appreciated.  Rectal:  Deferred  Msk:  Symmetrical without gross deformities. Normal posture. Extremities:  Without edema. Neurologic:  Alert and  oriented x4;  grossly normal neurologically. Skin:  Intact without significant lesions or rashes. Psych:  Alert and cooperative. Normal mood and affect.   Assessment   Madeeha Virrueta is a 47 y.o. female with a history of GERD, chronic bronchitis, and diabetes presenting today for evaluation of epigastric/RUQ pain.  Epigastric/RUQ pain, N/V/D, constipation, bloating: Upper abdominal pain, nausea, vomiting, diarrhea have resolved. Possibly drug-induced given recently started on GLP1 [facility]), last dose 5/10.  CT in the ED  negative.  Prescribed Protonix and Carafate by the ED which she has not taken.  Denies any reflux symptoms, dysphagia.  For now advised her that she does not need to pick these medications up and that her symptoms were likely caused by her medication.  She has not had a bowel movement in 4 days, typically goes every day or every other day.  Mild lower abdominal tenderness.  She feels as though she needs a cleanout therefore we will do a mini bowel cleanse with MiraLAX and then recommend MiraLAX once daily.  If no improvement with this Rehman bowel movement within a few days we will obtain a KUB as well as assess her thyroid level given some of her fatigue.  Also recommended daily fiber supplement and probiotic given her bloating and to help with her constipation.  Screening for colon cancer: Cologuard negative in February.  No alarm symptoms.  PLAN   Mini bowel cleanse with miralax then will start miralax 17 g once nightly.  TSH and KUB if no improvement in constipation.  Probiotic daily (Align or [facility]).  Fiber supplement.  Follow up in 2 months.  Progress report in 2-3 weeks.    Brooke Bonito, MSN, FNP-BC, AGACNP-BC [facility]

## 2022-08-19 NOTE — Patient Instructions (Signed)
If you would like to complete a bowel cleanse to see if this improves your constipation I want you to take 1 capful of MiraLAX mixed in 8 ounces of fluid every hour x 5.  About 30 minutes prior to beginning the MiraLAX cleanse and want you to take 1 Dulcolax 5 mg tablet.  24 hours after you complete your bowel cleanse I want you to start MiraLAX 17 g (1 capful) nightly.  You also likely benefit from daily fiber supplementation as well as a daily probiotic.  You can take Benefiber 2-3 teaspoons once daily in 8 ounces of water, fiber capsules, or fiber Gummies whenever you prefer.  Only start taking this once daily as you can initially have an increase of gassiness.  Probiotics can help with the bloating and gassiness -align or [facility] colon [facility] are your best over-the-counter options.   Please call me with a progress report in 2-3 weeks, sooner if needed.  If you do not have a bowel movement within the next 3-4 days please let me know as we will need to complete an abdominal x-ray and likely give you something stronger to help you have a bowel movement.  Continue to stay well-hydrated.  You should drink at least 6-8 bottles of water daily.  It was a pleasure to see you today. I want to create trusting relationships with patients. If you receive a survey regarding your visit,  I greatly appreciate you taking time to fill this out on paper or through your MyChart. I value your feedback.  Brooke Bonito, MSN, FNP-BC, AGACNP-BC [facility]

## 2022-09-26 ENCOUNTER — Ambulatory Visit: Payer: BC Managed Care – PPO | Admitting: Physician Assistant

## 2022-09-30 ENCOUNTER — Ambulatory Visit: Payer: BC Managed Care – PPO | Admitting: Physician Assistant

## 2022-10-01 ENCOUNTER — Ambulatory Visit: Payer: BC Managed Care – PPO | Admitting: Physician Assistant

## 2022-10-21 ENCOUNTER — Ambulatory Visit: Payer: BC Managed Care – PPO | Admitting: Gastroenterology

## 2022-11-05 ENCOUNTER — Other Ambulatory Visit: Payer: Self-pay | Admitting: Physician Assistant

## 2022-11-05 ENCOUNTER — Other Ambulatory Visit (INDEPENDENT_AMBULATORY_CARE_PROVIDER_SITE_OTHER): Payer: BC Managed Care – PPO

## 2022-11-05 ENCOUNTER — Telehealth: Payer: Self-pay

## 2022-11-05 DIAGNOSIS — Z0184 Encounter for antibody response examination: Secondary | ICD-10-CM

## 2022-11-05 DIAGNOSIS — Z111 Encounter for screening for respiratory tuberculosis: Secondary | ICD-10-CM

## 2022-11-05 NOTE — Telephone Encounter (Signed)
Patient advised and lab appt scheduled. 

## 2022-11-05 NOTE — Telephone Encounter (Signed)
Please advise correct orders

## 2022-11-05 NOTE — Telephone Encounter (Signed)
Patient starting Phlebotomy program this Saturday and needing titers for Measles, Mumps, Rubella, Varicella,Meningococcal and also a Quantiferon Gold. OK to place future lab orders or send orders to [facility]?

## 2022-11-07 ENCOUNTER — Other Ambulatory Visit (HOSPITAL_COMMUNITY): Payer: Self-pay

## 2022-11-07 ENCOUNTER — Telehealth: Payer: Self-pay

## 2022-11-07 LAB — VARICELLA ZOSTER ANTIBODY, IGG: Varicella IgG: 1448 {index}

## 2022-11-07 LAB — MEASLES/MUMPS/RUBELLA IMMUNITY
Mumps IgG: >300 [AU]/ml
Rubella: 1.68 {index}
Rubeola IgG: 99.8 [AU]/ml

## 2022-11-07 LAB — QUANTIFERON-TB GOLD PLUS
Mitogen-NIL: >10 [IU]/mL
NIL: 0.03 [IU]/mL
QuantiFERON-TB Gold Plus: NEGATIVE
TB1-NIL: <0 [IU]/mL
TB2-NIL: 0 [IU]/mL

## 2022-11-07 NOTE — Telephone Encounter (Signed)
Pharmacy Patient Advocate Encounter   Received notification from CoverMyMeds that prior authorization for [facility] 5MG /0.5ML pen-injectors is required/requested.   Insurance verification completed.   The patient is insured through [facility] .   Per test claim: PA required; PA submitted to [facility] via CoverMyMeds Key/confirmation #/EOC [id number] Status is pending    From Sponsored Renewals

## 2022-11-10 NOTE — Telephone Encounter (Signed)
Pharmacy Patient Advocate Encounter  Received notification from [facility] that Prior Authorization for [facility] 5MG /0.5ML pen-injectors has been APPROVED from 11/07/22 to 11/07/23   PA #/Case ID/Reference #: [id number]

## 2022-12-02 ENCOUNTER — Ambulatory Visit: Payer: BC Managed Care – PPO | Admitting: Radiology

## 2022-12-23 ENCOUNTER — Ambulatory Visit: Payer: BC Managed Care – PPO | Admitting: Radiology

## 2022-12-31 ENCOUNTER — Telehealth: Payer: Self-pay

## 2023-01-12 ENCOUNTER — Encounter: Payer: Self-pay | Admitting: Radiology

## 2023-01-22 ENCOUNTER — Telehealth: Payer: Self-pay | Admitting: Radiology

## 2023-01-22 DIAGNOSIS — Z23 Encounter for immunization: Secondary | ICD-10-CM | POA: Diagnosis not present

## 2023-01-22 NOTE — Telephone Encounter (Signed)
Patient no show to her October 1 pap & hpv appointment. Left message on October 7 & 10 and mailed letter on October 21 for patient to call and reschedule her missed appointment. Patient has not called back.

## 2023-02-04 NOTE — Telephone Encounter (Signed)
Call placed to patient, left detailed message, ok per dpr. Advised f/u on 6 mo f/u for PAP with HPV. Return call to Corazin, RN at [place], [phone], option 5.

## 2023-02-25 DIAGNOSIS — Z23 Encounter for immunization: Secondary | ICD-10-CM | POA: Diagnosis not present

## 2023-02-26 NOTE — Telephone Encounter (Signed)
Spoke with patient, patient states she is at work, unable to talk, will return call at later time/date.

## 2023-03-05 NOTE — Telephone Encounter (Signed)
No response from patient.   Letter pended and routed to Dr. Edward Jolly to review.

## 2023-03-05 NOTE — Telephone Encounter (Signed)
Agree with the letter.  Please leave on my desk for me to sign.

## 2023-03-06 NOTE — Telephone Encounter (Signed)
Letter printed and to Dr. Edward Jolly to sign and be mailed.   Encounter closed.

## 2023-03-10 NOTE — Telephone Encounter (Signed)
Letter signed by [facility] and mailed.

## 2023-03-13 ENCOUNTER — Encounter: Payer: Self-pay | Admitting: Physician Assistant

## 2023-03-13 NOTE — Telephone Encounter (Signed)
 Care team updated and letter sent for eye exam notes.

## 2023-06-08 ENCOUNTER — Encounter: Payer: Self-pay | Admitting: Physician Assistant

## 2023-06-14 ENCOUNTER — Other Ambulatory Visit: Payer: Self-pay

## 2023-06-14 ENCOUNTER — Emergency Department (HOSPITAL_COMMUNITY)
Admission: EM | Admit: 2023-06-14 | Discharge: 2023-06-14 | Disposition: A | Discharge status: Home / Self Care | Attending: Emergency Medicine | Admitting: Emergency Medicine

## 2023-06-14 ENCOUNTER — Encounter (HOSPITAL_COMMUNITY): Payer: Self-pay

## 2023-06-14 DIAGNOSIS — E1165 Type 2 diabetes mellitus with hyperglycemia: Secondary | ICD-10-CM | POA: Diagnosis not present

## 2023-06-14 DIAGNOSIS — R112 Nausea with vomiting, unspecified: Secondary | ICD-10-CM | POA: Diagnosis not present

## 2023-06-14 DIAGNOSIS — R739 Hyperglycemia, unspecified: Secondary | ICD-10-CM | POA: Diagnosis not present

## 2023-06-14 DIAGNOSIS — E860 Dehydration: Secondary | ICD-10-CM | POA: Insufficient documentation

## 2023-06-14 DIAGNOSIS — R197 Diarrhea, unspecified: Secondary | ICD-10-CM | POA: Insufficient documentation

## 2023-06-14 LAB — CBG MONITORING, ED: Glucose-Capillary: 305 mg/dL — ABNORMAL HIGH (ref 70–99)

## 2023-06-14 LAB — COMPREHENSIVE METABOLIC PANEL
ALT: 22 U/L (ref 0–44)
AST: 19 U/L (ref 15–41)
Albumin: 3.7 g/dL (ref 3.5–5.0)
Alkaline Phosphatase: 88 U/L (ref 38–126)
Anion gap: 12 (ref 5–15)
BUN: 15 mg/dL (ref 6–20)
CO2: 26 mmol/L (ref 22–32)
Calcium: 8.9 mg/dL (ref 8.9–10.3)
Chloride: 98 mmol/L (ref 98–111)
Creatinine, Ser: 0.66 mg/dL (ref 0.44–1.00)
GFR, Estimated: >60 mL/min (ref >60)
Glucose, Bld: 381 mg/dL — ABNORMAL HIGH (ref 70–99)
Potassium: 3.9 mmol/L (ref 3.5–5.1)
Sodium: 136 mmol/L (ref 135–145)
Total Bilirubin: 2.2 mg/dL — ABNORMAL HIGH (ref 0.0–1.2)
Total Protein: 7.3 g/dL (ref 6.5–8.1)

## 2023-06-14 LAB — LIPASE, BLOOD: Lipase: 28 U/L (ref 11–51)

## 2023-06-14 LAB — CBC
HCT: 45.6 % (ref 36.0–46.0)
Hemoglobin: 15.8 g/dL — ABNORMAL HIGH (ref 12.0–15.0)
MCH: 32.6 pg (ref 26.0–34.0)
MCHC: 34.6 g/dL (ref 30.0–36.0)
MCV: 94.2 fL (ref 80.0–100.0)
Platelets: 178 10*3/uL (ref 150–400)
RBC: 4.84 MIL/uL (ref 3.87–5.11)
RDW: 11.9 % (ref 11.5–15.5)
WBC: 8 10*3/uL (ref 4.0–10.5)
nRBC: 0 % (ref 0.0–0.2)

## 2023-06-14 MED ORDER — LACTATED RINGERS IV BOLUS
1000.0000 mL | Freq: Once | INTRAVENOUS | Status: AC
  Administered 2023-06-14: 1000 mL via INTRAVENOUS

## 2023-06-14 MED ORDER — INSULIN ASPART 100 UNIT/ML IJ SOLN
8.0000 [IU] | Freq: Once | INTRAMUSCULAR | Status: AC
  Administered 2023-06-14: 8 [IU] via SUBCUTANEOUS
  Filled 2023-06-14: qty 1

## 2023-06-14 MED ORDER — ONDANSETRON 8 MG PO TBDP: 8.0000 mg | ORAL_TABLET | Freq: Three times a day (TID) | ORAL | 0 refills | Status: DC | PRN

## 2023-06-14 MED ORDER — ONDANSETRON HCL 4 MG/2ML IJ SOLN
4.0000 mg | Freq: Once | INTRAMUSCULAR | Status: AC
  Administered 2023-06-14: 4 mg via INTRAVENOUS
  Filled 2023-06-14: qty 2

## 2023-06-14 NOTE — ED Triage Notes (Signed)
 Pt c/o n/v/d since this morning around 0100. Multiple episodes of vomiting (unable to take Zofran.) Pt does state cold chills no reading of temp.

## 2023-06-14 NOTE — ED Provider Notes (Signed)
 [facility] EMERGENCY DEPARTMENT AT [facility] Provider Note   CSN: [id number] Arrival date & time: 06/14/23  1032     History  Chief Complaint  Patient presents with   Emesis    Tanya Zamora is a 48 y.o. female.  Pt with nausea/vomiting/diarrhea since last night. Several episodes. Emesiss not bloody or bilious. Diarrhea watery, not bloody. Mild intermittent abd cramping discomfort, no constant and/or focal abd pain. No fever or chills. No specific known ill contacts or bad food ingestion. No recent abx use.   The history is provided by the patient and medical records.  Emesis Associated symptoms: diarrhea   Associated symptoms: no cough, no fever, no headaches and no sore throat        Home Medications Prior to Admission medications   Medication Sig Start Date End Date Taking? Authorizing Provider  ondansetron (ZOFRAN-ODT) 8 MG disintegrating tablet Take 1 tablet (8 mg total) by mouth every 8 (eight) hours as needed for nausea or vomiting. 06/14/23  Yes Cathren Laine, MD  blood glucose meter kit and supplies KIT Dispense based on patient and insurance preference. Use up to four times daily as directed. Patient not taking: Reported on 08/19/2022 02/11/22   Allwardt, Crist Infante, PA-C  famotidine (PEPCID) 20 MG tablet Take 1 tablet (20 mg total) by mouth 2 (two) times daily. Patient not taking: Reported on 08/19/2022 08/13/22   Mesner, Barbara Cower, MD  glipiZIDE (GLUCOTROL XL) 2.5 MG 24 hr tablet Take 1 tablet (2.5 mg total) by mouth daily with breakfast. 05/06/22   Allwardt, Crist Infante, PA-C  pantoprazole (PROTONIX) 20 MG tablet Take 1 tablet (20 mg total) by mouth daily for 10 days. Patient not taking: Reported on 08/19/2022 08/13/22 08/23/22  Mesner, Barbara Cower, MD  pravastatin (PRAVACHOL) 20 MG tablet Take 1 tablet (20 mg total) by mouth every other day. 07/01/22   Allwardt, Crist Infante, PA-C  sucralfate (CARAFATE) 1 GM/10ML suspension Take 10 mLs (1 g total) by mouth 4 (four) times daily -   with meals and at bedtime. Patient not taking: Reported on 08/19/2022 08/13/22   Mesner, Barbara Cower, MD  tirzepatide [facility]) 5 MG/0.5ML Pen Inject 5 mg into the skin once a week. Patient not taking: Reported on 08/19/2022 05/06/22   Allwardt, Crist Infante, PA-C      Allergies    Metformin and related    Review of Systems   Review of Systems  Constitutional:  Negative for fever.  HENT:  Negative for sore throat.   Respiratory:  Negative for cough and shortness of breath.   Cardiovascular:  Negative for chest pain.  Gastrointestinal:  Positive for diarrhea, nausea and vomiting.  Genitourinary:  Negative for dysuria and flank pain.  Musculoskeletal:  Negative for neck pain and neck stiffness.  Neurological:  Negative for headaches.    Physical Exam Updated Vital Signs BP 139/75   Pulse 98   Resp (!) 22   Ht 1.575 m (5\' 2" )   Wt 108.9 kg   LMP 05/31/2023   SpO2 93%   BMI 43.90 kg/m  Physical Exam Vitals and nursing note reviewed.  Constitutional:      Appearance: Normal appearance. She is well-developed.  HENT:     Head: Atraumatic.     Nose: Nose normal.     Mouth/Throat:     Mouth: Mucous membranes are moist.     Pharynx: Oropharynx is clear.  Eyes:     General: No scleral icterus.    Conjunctiva/sclera: Conjunctivae normal.  Neck:  Trachea: No tracheal deviation.  Cardiovascular:     Rate and Rhythm: Normal rate and regular rhythm.     Pulses: Normal pulses.     Heart sounds: Normal heart sounds. No murmur heard.    No friction rub. No gallop.  Pulmonary:     Effort: Pulmonary effort is normal. No respiratory distress.     Breath sounds: Normal breath sounds.  Abdominal:     General: Bowel sounds are normal. There is no distension.     Palpations: Abdomen is soft. There is no mass.     Tenderness: There is no abdominal tenderness. There is no guarding or rebound.     Hernia: No hernia is present.  Genitourinary:    Comments: No cva tenderness.  Musculoskeletal:         General: No swelling or tenderness.     Cervical back: Normal range of motion and neck supple. No rigidity. No muscular tenderness.  Skin:    General: Skin is warm and dry.     Findings: No rash.  Neurological:     Mental Status: She is alert.     Comments: Alert, speech normal.   Psychiatric:        Mood and Affect: Mood normal.     ED Results / Procedures / Treatments   Labs (all labs ordered are listed, but only abnormal results are displayed) Results for orders placed or performed during the hospital encounter of 06/14/23  Comprehensive metabolic panel   Collection Time: 06/14/23 11:30 AM  Result Value Ref Range   Sodium 136 135 - 145 mmol/L   Potassium 3.9 3.5 - 5.1 mmol/L   Chloride 98 98 - 111 mmol/L   CO2 26 22 - 32 mmol/L   Glucose, Bld 381 (H) 70 - 99 mg/dL   BUN 15 6 - 20 mg/dL   Creatinine, Ser 2.95 0.44 - 1.00 mg/dL   Calcium 8.9 8.9 - 62.1 mg/dL   Total Protein 7.3 6.5 - 8.1 g/dL   Albumin 3.7 3.5 - 5.0 g/dL   AST 19 15 - 41 U/L   ALT 22 0 - 44 U/L   Alkaline Phosphatase 88 38 - 126 U/L   Total Bilirubin 2.2 (H) 0.0 - 1.2 mg/dL   GFR, Estimated >30 >86 mL/min   Anion gap 12 5 - 15  CBC   Collection Time: 06/14/23 11:30 AM  Result Value Ref Range   WBC 8.0 4.0 - 10.5 K/uL   RBC 4.84 3.87 - 5.11 MIL/uL   Hemoglobin 15.8 (H) 12.0 - 15.0 g/dL   HCT 57.8 46.9 - 62.9 %   MCV 94.2 80.0 - 100.0 fL   MCH 32.6 26.0 - 34.0 pg   MCHC 34.6 30.0 - 36.0 g/dL   RDW 52.8 41.3 - 24.4 %   Platelets 178 150 - 400 K/uL   nRBC 0.0 0.0 - 0.2 %  Lipase, blood   Collection Time: 06/14/23 11:30 AM  Result Value Ref Range   Lipase 28 11 - 51 U/L    EKG None  Radiology No results found.  Procedures Procedures    Medications Ordered in ED Medications  lactated ringers bolus 1,000 mL ( Intravenous Restarted 06/14/23 1303)  ondansetron (ZOFRAN) injection 4 mg (4 mg Intravenous Given 06/14/23 1146)  lactated ringers bolus 1,000 mL (1,000 mLs Intravenous New  Bag/Given 06/14/23 1315)  insulin aspart (novoLOG) injection 8 Units (8 Units Subcutaneous Given 06/14/23 1311)    ED Course/ Medical Decision Making/ A&P  Medical Decision Making Problems Addressed: Dehydration: acute illness or injury with systemic symptoms that poses a threat to life or bodily functions Hyperglycemia: acute illness or injury Nausea and vomiting in adult: acute illness or injury with systemic symptoms that poses a threat to life or bodily functions  Amount and/or Complexity of Data Reviewed External Data Reviewed: notes. Labs: ordered. Decision-making details documented in ED Course.  Risk Prescription drug management. Decision regarding hospitalization.   Iv ns. Labs ordered/sent.  Differential diagnosis includes  . Dispo decision including potential need for admission considered - will get labs and reassess.   Reviewed nursing notes and prior charts for additional history. External reports reviewed.  LR bolus. Zofran iv.   Labs reviewed/interpreted by me - wbc and hgb normal. Glucose high 381. Hco3 normal, ag not elevated. Ivf bolus. Novolog sq.  Pt indicates does not tolerate metformin, and has not been taking her glipizide (although does have adequate supply at home), and is not following any specific diabetes meal plan. Rec continue meds, follow diabetes meal plan, monitor sugars, and f/u closely w pcp.   Recheck, pt feels much improved. No recurrent nv. No abd pain. Abd soft non tender. Tolerating po. Pt currently appears stable for d/c.   Rec close pcp f/u.  Return precautions provided.          Final Clinical Impression(s) / ED Diagnoses Final diagnoses:  Nausea and vomiting in adult  Dehydration  Hyperglycemia    Rx / DC Orders ED Discharge Orders          Ordered    ondansetron (ZOFRAN-ODT) 8 MG disintegrating tablet  Every 8 hours PRN        06/14/23 1346              Cathren Laine,  MD 06/14/23 1349

## 2023-06-14 NOTE — Discharge Instructions (Addendum)
 It was our pleasure to provide your ER care today - we hope that you feel better.   Drink plenty of fluids/stay well hydrated. Take zofran as need for nausea.   Follow up with primary care doctor in the next 2-3 days if symptoms fail to improve/resolve.  Your blood sugar is high - follow diabetes eating plan, monitor blood sugars, continue your diabetes medication, and follow up closely with primary care doctor in the next 1-2 weeks.   Return to ER if worse, new symptoms, fevers, persistent vomiting, new or worsening or severe abdominal pain, or other concern.

## 2023-06-14 NOTE — ED Notes (Signed)
 Patient given 2 12 oz ice waters and is tolerating Po fluids

## 2023-08-04 ENCOUNTER — Other Ambulatory Visit (HOSPITAL_COMMUNITY): Payer: Self-pay

## 2023-08-04 ENCOUNTER — Ambulatory Visit: Admitting: Medical

## 2023-08-04 VITALS — BP 108/70 | HR 77 | Temp 97.8°F

## 2023-08-04 DIAGNOSIS — S80211A Abrasion, right knee, initial encounter: Secondary | ICD-10-CM | POA: Diagnosis not present

## 2023-08-04 DIAGNOSIS — M79645 Pain in left finger(s): Secondary | ICD-10-CM | POA: Diagnosis not present

## 2023-08-04 DIAGNOSIS — L089 Local infection of the skin and subcutaneous tissue, unspecified: Secondary | ICD-10-CM | POA: Diagnosis not present

## 2023-08-04 DIAGNOSIS — E1165 Type 2 diabetes mellitus with hyperglycemia: Secondary | ICD-10-CM | POA: Diagnosis not present

## 2023-08-04 DIAGNOSIS — W108XXA Fall (on) (from) other stairs and steps, initial encounter: Secondary | ICD-10-CM

## 2023-08-04 LAB — POCT GLYCOSYLATED HEMOGLOBIN (HGB A1C): Hemoglobin A1C: 11.3 % — AB (ref 4.0–5.6)

## 2023-08-04 MED ORDER — FREESTYLE LIBRE 3 PLUS SENSOR MISC
2 refills | Status: DC
  Filled 2023-08-04: qty 1, 15d supply, fill #0

## 2023-08-04 MED ORDER — TOUJEO MAX SOLOSTAR 300 UNIT/ML ~~LOC~~ SOPN: 10.0000 [IU] | PEN_INJECTOR | Freq: Every day | SUBCUTANEOUS | Status: DC

## 2023-08-04 MED ORDER — AMOXICILLIN-POT CLAVULANATE 875-125 MG PO TABS
1.0000 | ORAL_TABLET | Freq: Two times a day (BID) | ORAL | 0 refills | Status: DC
  Filled 2023-08-04: qty 20, 10d supply, fill #0

## 2023-08-04 NOTE — Patient Instructions (Signed)
 Skin infection, fall - supervising physician Dr. Robina Chol also examined.  No signs or concerns for septic joint.  This seems to be superficial skin infection/abrasion  Begin Augmentin right away twice daily x 10 days Use relative rest, and you can use over-the-counter arm sling periodically if needed the next few days Use warm compresses such as warm rag 15 to 20 minutes 2 or 3 times a day for the next few days to the elbow If fever, body aches and chills are much worse symptoms in the next 72 hours get reevaluated right away You can use over the counter tylenol 325mg  or 500mg  twice daily or 3 times daily for pain for the next few days Recheck later this week   Diabetes, uncontrolled  Uncontrolled, last medication about a year ago. Given her hemoglobin A1c of 11% today we will initiate therapy right away. Begin Toujeo 10u daily.  We had her self administer today and demonstrated proper use of medication. Sample given. Counseled on diet, exercise, glucose monitoring.  Freestyle libre sample placed today Establish soon with PCP  Abrasion right knee - resolving  Finger pain - sprain/bruising from fall, but normal range of motion today.  Symptoms should gradually improve over the next week.

## 2023-08-04 NOTE — Progress Notes (Signed)
 Subjective:  Tanya Zamora is a 48 y.o. female who presents for Chief Complaint  Patient presents with   Acute Visit    Elbow abrasion. Fell Saturday off porch. Cleaned with peroxide and alcohol and Neosporin. Hot, red, green discharge. inflammed     Here for elbow concern.  She fell 4 days ago off her porch at home.  She was leaning out to look at her garden, and fell forwards down steps.  Landed on both elbows, but more on the right.  Landed on elbows and knees.  Left fingers hurt a little, left elbow ok, but right elbow hurts.  Starting yesterday however, has new redness, worse pain, and some pus drainage.  No fever.   Elbow is hot.  No body aches or chills, but feels somewhat ill.  ROM of right elbow is ok.   Left hand feels a little tingly.  Right arm not numb or tingly.  Is right handed.  Back of head hurts a little from muscle tension.  No head injury, no LOC.  Is diabetic.  Doesn't currently have a glucometer.  Last medication was a year ago.  Was on mounjaro  prior, but got sick on 5mg , bad nausea.   She also hasn't tolerated metformin prior due to diarrhea even at lower doses.  She has tolerated glipizide  prior.    No other aggravating or relieving factors.    No other c/o.  Past Medical History:  Diagnosis Date   Allergy    Chronic bronchitis (HCC)    Diabetes mellitus without complication (HCC)    GERD (gastroesophageal reflux disease)    No current outpatient medications on file prior to visit.   No current facility-administered medications on file prior to visit.     The following portions of the patient's history were reviewed and updated as appropriate: allergies, current medications, past family history, past medical history, past social history, past surgical history and problem list.  ROS Otherwise as in subjective above    Objective: BP 108/70   Pulse 77   Temp 97.8 F (36.6 C)   SpO2 98%   General appearance: alert, no distress, well developed, well  nourished Neck: Supple, nontender, normal range of motion Skin: Right elbow superficially with some erythema proximal 8 cm diameter area of pinkish-red coloration with slight warmth but no induration.  No fluctuance.  There is some crusting centrally and some dried pus She has a scab over right anterior knee MSK: Right elbow tender over the area where seen with otherwise arm and elbow nontender with normal range of motion of elbow.  Otherwise knees and elbows nontender with normal range of motion.  Rest of extremities unremarkable Arms and legs neurovascular intact    Assessment: Encounter Diagnoses  Name Primary?   Skin infection Yes   Fall (on) (from) other stairs and steps, initial encounter    Type 2 diabetes mellitus with hyperglycemia, unspecified whether long term insulin  use (HCC)    Abrasion of right knee, initial encounter    Pain of finger of left hand      Plan: Skin infection, fall - supervising physician Tanya Zamora also examined.  No signs or concerns for septic joint.  This seems to be superficial skin infection/abrasion  Begin Augmentin right away twice daily x 10 days Use relative rest, and you can use over-the-counter arm sling periodically if needed the next few days Use warm compresses such as warm rag 15 to 20 minutes 2 or 3 times a day for  the next few days to the elbow If fever, body aches and chills are much worse symptoms in the next 72 hours get reevaluated right away You can use over the counter tylenol 325mg  or 500mg  twice daily or 3 times daily for pain for the next few days Recheck later this week   Diabetes, uncontrolled  Uncontrolled, last medication about a year ago. Given her hemoglobin A1c of 11% today we will initiate therapy right away. Begin Toujeo 10u daily.  We had her self administer today and demonstrated proper use of medication. Sample given. Counseled on diet, exercise, glucose monitoring.  Freestyle libre sample placed today Establish  soon with PCP  Abrasion right knee - resolving  Finger pain - sprain/bruising from fall, but normal range of motion today.  Symptoms should gradually improve over the next week.     Tanya Zamora was seen today for acute visit.  Diagnoses and all orders for this visit:  Skin infection  Fall (on) (from) other stairs and steps, initial encounter  Type 2 diabetes mellitus with hyperglycemia, unspecified whether long term insulin  use (HCC) -     HgB A1c  Abrasion of right knee, initial encounter  Pain of finger of left hand  Other orders -     amoxicillin-clavulanate (AUGMENTIN) 875-125 MG tablet; Take 1 tablet by mouth 2 (two) times daily. -     Continuous Glucose Sensor (FREESTYLE LIBRE 3 PLUS SENSOR) MISC; Change sensor every 15 days. -     insulin  glargine, 2 Unit Dial, (TOUJEO MAX SOLOSTAR) 300 UNIT/ML Solostar Pen; Inject 10 Units into the skin daily.    Follow up: establish with PCP very soon.

## 2023-08-05 ENCOUNTER — Other Ambulatory Visit (HOSPITAL_COMMUNITY): Payer: Self-pay

## 2023-08-05 MED ORDER — PEN NEEDLES 32G X 4 MM MISC
1 refills | Status: DC
  Filled 2023-08-05: qty 100, 90d supply, fill #0

## 2023-08-05 NOTE — Addendum Note (Signed)
 Addended by: Charliene Conte A on: 08/05/2023 10:17 AM   Modules accepted: Orders

## 2023-11-02 ENCOUNTER — Other Ambulatory Visit: Payer: Self-pay

## 2023-11-02 ENCOUNTER — Other Ambulatory Visit (HOSPITAL_COMMUNITY): Payer: Self-pay

## 2023-11-02 ENCOUNTER — Ambulatory Visit (INDEPENDENT_AMBULATORY_CARE_PROVIDER_SITE_OTHER): Admitting: Medical

## 2023-11-02 VITALS — BP 120/80 | HR 75 | Temp 99.1°F

## 2023-11-02 DIAGNOSIS — H60392 Other infective otitis externa, left ear: Secondary | ICD-10-CM | POA: Diagnosis not present

## 2023-11-02 DIAGNOSIS — E118 Type 2 diabetes mellitus with unspecified complications: Secondary | ICD-10-CM

## 2023-11-02 DIAGNOSIS — H6690 Otitis media, unspecified, unspecified ear: Secondary | ICD-10-CM

## 2023-11-02 MED ORDER — ONDANSETRON 4 MG PO TBDP
4.0000 mg | ORAL_TABLET | Freq: Three times a day (TID) | ORAL | 0 refills | Status: DC | PRN
  Filled 2023-11-02: qty 30, 10d supply, fill #0

## 2023-11-02 MED ORDER — FLUCONAZOLE 100 MG PO TABS
100.0000 mg | ORAL_TABLET | Freq: Every day | ORAL | 0 refills | Status: DC
  Filled 2023-11-02: qty 7, 7d supply, fill #0

## 2023-11-02 MED ORDER — AMOXICILLIN-POT CLAVULANATE 875-125 MG PO TABS
1.0000 | ORAL_TABLET | Freq: Two times a day (BID) | ORAL | 0 refills | Status: DC
  Filled 2023-11-02: qty 20, 10d supply, fill #0

## 2023-11-02 MED ORDER — NEOMYCIN-POLYMYXIN-HC 3.5-10000-1 OT SUSP
3.0000 [drp] | Freq: Four times a day (QID) | OTIC | 0 refills | Status: DC
  Filled 2023-11-02: qty 10, 8d supply, fill #0

## 2023-11-02 NOTE — Progress Notes (Signed)
 Subjective:  Tanya Zamora is a 48 y.o. female who presents for Chief Complaint  Patient presents with   Acute Visit    Ear pain since Saturday, drainage and blood, knot on outside of ear and painful. Ear is swollen and painful to touch     Here for not feeling well.  Had headache 2 days ago, some ear pain.  However, left ear and left neck achy.  Feels lump on left in front of ear.  No fever.  Has some sore throat from ear pressure.  No congestion, no runny nose.   No NVD.  No dizziness.   Using some motrin.   No swimming.  No sick contacts with similar.   Cleans ears regularly in general with qtips.   For a few weeks has had some oily and some time blood tinged drainage from left ears.    Diabetes - she continues on Jardiance that she is using from her sister who is not taking this currently.  She also is on Toujeo10u daily.   Sugars a few months ago were in the 300s, but now in the 180-190 range fasting.  She has tolerated mounjaro  in past but with some nausea.  No other aggravating or relieving factors.    No other c/o.  Past Medical History:  Diagnosis Date   Allergy    Chronic bronchitis (HCC)    Diabetes mellitus without complication (HCC)    GERD (gastroesophageal reflux disease)    Current Outpatient Medications on File Prior to Visit  Medication Sig Dispense Refill   empagliflozin (JARDIANCE) 10 MG TABS tablet Take 10 mg by mouth daily.     No current facility-administered medications on file prior to visit.     The following portions of the patient's history were reviewed and updated as appropriate: allergies, current medications, past family history, past medical history, past social history, past surgical history and problem list.  ROS Otherwise as in subjective above  Objective: BP 120/80   Pulse 75   Temp 99.1 F (37.3 C)   SpO2 99%   General appearance: alert, no distress, well developed, well nourished HEENT: left ear canal swollen and mucoid debris in  canal, preuaricial node palpated, tender with ear exam, TM erythematous.   Right TM with bulging and erythema, decreased light reflex.  Otherwise  normocephalic, sclerae anicteric, conjunctiva pink and moist, TMs pearly, nares patent, no discharge or erythema, pharynx normal Oral cavity: MMM, no lesions Neck: supple, no lymphadenopathy, no thyromegaly, no masses    Assessment: Encounter Diagnoses  Name Primary?   Infection of external ear, left Yes   Acute otitis media, unspecified otitis media type    DM (diabetes mellitus) with complications (HCC)      Plan: Otitis externa - discussed avoiding moisture in left ear.   Use Polytrim drops x 1 week  Otitis media - begin Augmentin .   If not improving or if worse in the next 3- 5 days then recheck  Diabetes - continue Toujeo  but increase from 10 u to 15units today.  Can increase 2-3 units of Toujeo  per week until at goal.   Continue Jardiance.  Begin samples of Rybelsus 3mg  daily.   Gave zofran  to use for nausea prn.  Discussed medications, risks/benefits.  F/u/establish with PCP  Diflucan  prescribed per patient request in event of yeast vaginitis from antibiotic use above   [name] was seen today for acute visit.  Diagnoses and all orders for this visit:  Infection of external ear, left  Acute otitis media, unspecified otitis media type  DM (diabetes mellitus) with complications (HCC)  Other orders -     neomycin -polymyxin-hydrocortisone (CORTISPORIN ) 3.5-10000-1 OTIC suspension; Place 3 drops into both ears 4 (four) times daily. -     amoxicillin -clavulanate (AUGMENTIN ) 875-125 MG tablet; Take 1 tablet by mouth 2 (two) times daily. -     ondansetron  (ZOFRAN -ODT) 4 MG disintegrating tablet; Take 1 tablet (4 mg total) by mouth every 8 (eight) hours as needed for nausea or vomiting. -     fluconazole  (DIFLUCAN ) 100 MG tablet; Take 1 tablet (100 mg total) by mouth daily.    Follow up: with PCP

## 2023-11-04 ENCOUNTER — Other Ambulatory Visit (HOSPITAL_COMMUNITY): Payer: Self-pay

## 2023-11-04 ENCOUNTER — Other Ambulatory Visit: Payer: Self-pay | Admitting: Medical

## 2023-11-04 MED ORDER — HYDROCODONE-ACETAMINOPHEN 5-325 MG PO TABS
1.0000 | ORAL_TABLET | Freq: Four times a day (QID) | ORAL | 0 refills | Status: DC | PRN
  Filled 2023-11-04: qty 10, 3d supply, fill #0

## 2023-11-04 MED ORDER — CIPROFLOXACIN HCL 500 MG PO TABS
500.0000 mg | ORAL_TABLET | Freq: Two times a day (BID) | ORAL | 0 refills | Status: AC
  Filled 2023-11-04: qty 10, 5d supply, fill #0
  Filled 2023-11-04: qty 14, 7d supply, fill #0
  Filled 2023-11-04: qty 10, 5d supply, fill #0

## 2023-11-04 NOTE — Progress Notes (Signed)
 Not improivng.  Add ciprio for pseudomonas coverage and norco for pain short term

## 2023-11-11 ENCOUNTER — Other Ambulatory Visit (HOSPITAL_COMMUNITY): Payer: Self-pay

## 2023-11-11 ENCOUNTER — Telehealth: Payer: Self-pay

## 2023-11-11 NOTE — Telephone Encounter (Signed)
 Pharmacy Patient Advocate Encounter   Received notification from Onbase that prior authorization for Mounjaro  5 is due for renewal.   Insurance verification completed.   The patient is insured through [facility].  Action: PA required; PA submitted to above mentioned insurance via Latent Key/confirmation #/EOC [facility] Status is pending

## 2023-11-12 ENCOUNTER — Other Ambulatory Visit (HOSPITAL_COMMUNITY): Payer: Self-pay

## 2023-11-12 NOTE — Telephone Encounter (Signed)
 Pharmacy Patient Advocate Encounter  Received notification from [employer] that Prior Authorization for Mounjaro  5 has been APPROVED from 11/11/23 to 11/10/23. Ran test claim, Copay is $25.00. This test claim was processed through [facility]- copay amounts may vary at other pharmacies due to pharmacy/plan contracts, or as the patient moves through the different stages of their insurance plan.   PA #/Case ID/Reference #: [id number]

## 2023-12-04 ENCOUNTER — Other Ambulatory Visit (HOSPITAL_COMMUNITY): Payer: Self-pay

## 2023-12-04 ENCOUNTER — Encounter: Payer: Self-pay | Admitting: Nurse Practitioner

## 2023-12-04 ENCOUNTER — Ambulatory Visit (INDEPENDENT_AMBULATORY_CARE_PROVIDER_SITE_OTHER): Admitting: Nurse Practitioner

## 2023-12-04 ENCOUNTER — Other Ambulatory Visit: Payer: Self-pay | Admitting: Nurse Practitioner

## 2023-12-04 DIAGNOSIS — R21 Rash and other nonspecific skin eruption: Secondary | ICD-10-CM

## 2023-12-04 DIAGNOSIS — S30861A Insect bite (nonvenomous) of abdominal wall, initial encounter: Secondary | ICD-10-CM

## 2023-12-04 MED ORDER — DOXYCYCLINE HYCLATE 100 MG PO TABS
100.0000 mg | ORAL_TABLET | Freq: Two times a day (BID) | ORAL | 0 refills | Status: AC
Start: 2023-12-04 — End: 2023-12-11
  Filled 2023-12-04: qty 14, 7d supply, fill #0

## 2023-12-04 MED ORDER — PREDNISONE 20 MG PO TABS
20.0000 mg | ORAL_TABLET | Freq: Every day | ORAL | 0 refills | Status: AC
Start: 2023-12-04 — End: 2023-12-09
  Filled 2023-12-04: qty 5, 5d supply, fill #0

## 2023-12-04 MED ORDER — TRIAMCINOLONE ACETONIDE 0.1 % EX CREA
1.0000 | TOPICAL_CREAM | Freq: Two times a day (BID) | CUTANEOUS | 0 refills | Status: DC
  Filled 2023-12-04: qty 30, 15d supply, fill #0

## 2023-12-04 NOTE — Progress Notes (Signed)
  Camie FORBES Doing, DNP, AGNP-c [facility] [address] [place], [place] [address] [phone]   ACUTE VISIT on 12/04/2023  There were no vitals taken for this visit.  Subjective:  HPI  Acquanetta presents for an erythematous rash on her trunk and extremities with pinpoint macules. She believes these are chigger bites. She reports the areas are pruritic. She has not tried anything for management.    ROS negative except for what is listed in HPI. History, Medications, Surgery, SDOH, and Family History reviewed and updated as appropriate.  Objective:  Physical Exam Constitutional:      Appearance: Normal appearance. She is not ill-appearing.  Eyes:     Conjunctiva/sclera: Conjunctivae normal.  Skin:    General: Skin is warm and dry.     Findings: Rash present. Rash is macular.     Comments: Scattered 1-50mm erythematous macules on chest, abdomen, back, and extremities. No signs of infection. No fever  Neurological:     Mental Status: She is alert.         Assessment & Plan:   Problem List Items Addressed This Visit   None Visit Diagnoses       Rash    -  Primary      Rash suspected to be from bites given that the patient was outdoors. No signs of systemic infection. Will start treatment with prednisone  to help with itching and add topical steroid cream for additional management. Doxycycline  for possible infection given the volume of bites and scratching present. F/U if symptoms do not improve.    Camie FORBES Doing, DNP, AGNP-c Time: 10 minutes, >50% spent counseling, care coordination, chart review, and documentation.

## 2023-12-04 NOTE — Progress Notes (Signed)
 Anthropod bites scattered with intense pruritus. Treatment with oral and topical steroid and oral doxycycline  for open wounds.

## 2024-01-27 ENCOUNTER — Other Ambulatory Visit (HOSPITAL_COMMUNITY): Payer: Self-pay

## 2024-01-27 ENCOUNTER — Other Ambulatory Visit: Payer: Self-pay

## 2024-01-27 ENCOUNTER — Ambulatory Visit (INDEPENDENT_AMBULATORY_CARE_PROVIDER_SITE_OTHER): Admitting: Family Medicine

## 2024-01-27 VITALS — BP 124/80 | HR 100 | Temp 98.0°F

## 2024-01-27 DIAGNOSIS — Z7984 Long term (current) use of oral hypoglycemic drugs: Secondary | ICD-10-CM

## 2024-01-27 DIAGNOSIS — E1165 Type 2 diabetes mellitus with hyperglycemia: Secondary | ICD-10-CM | POA: Diagnosis not present

## 2024-01-27 DIAGNOSIS — H60312 Diffuse otitis externa, left ear: Secondary | ICD-10-CM | POA: Diagnosis not present

## 2024-01-27 MED ORDER — FLUCONAZOLE 100 MG PO TABS
100.0000 mg | ORAL_TABLET | Freq: Every day | ORAL | 0 refills | Status: DC
  Filled 2024-01-27: qty 7, 7d supply, fill #0

## 2024-01-27 MED ORDER — NEOMYCIN-POLYMYXIN-HC 3.5-10000-1 OT SUSP
3.0000 [drp] | Freq: Four times a day (QID) | OTIC | 0 refills | Status: DC
  Filled 2024-01-27: qty 10, 17d supply, fill #0

## 2024-01-27 MED ORDER — AMOXICILLIN-POT CLAVULANATE 875-125 MG PO TABS
1.0000 | ORAL_TABLET | Freq: Two times a day (BID) | ORAL | 0 refills | Status: DC
  Filled 2024-01-27: qty 20, 10d supply, fill #0

## 2024-01-27 NOTE — Progress Notes (Signed)
   Subjective:    Patient ID: Tanya Zamora, female    DOB: 1975/09/30, 48 y.o.   MRN: [id number]  Discussed the use of AI scribe software for clinical note transcription with the patient, who gave verbal consent to proceed.  History of Present Illness   Tanya Zamora is a 48 year old female with diabetes who presents with ear discharge and poorly controlled blood sugars.  She has been experiencing ear discharge described as 'pouring out oil' primarily at night. This issue has been occurring intermittently for a couple of years, with the most recent exacerbation starting approximately three months ago, coinciding with an ear infection. She was treated with Augmentin  and cortisporin  ear drops, which were effective in resolving the infection.  She has experienced difficulty tolerating certain diabetes medications, including Mounjaro  and metformin. She was able to tolerate Mounjaro  at a dose of 2.5 mg for several months, but increasing the dose to 5 mg resulted in adverse gastrointestinal symptoms, including 'nasty, smelly burps' and nausea, sometimes leading to vomiting. She is currently using Soliqua, which she started about four months ago, but her blood sugars remain elevated, ranging from 170 to 230 mg/dL. Her morning blood sugars are typically between 150 to 160 mg/dL.  She lives in [place], close to [facility].           Review of Systems     Objective:    Physical Exam Alert and in no distress.  Left external canal is erythematous with some drainage.  Tympanic membrane was normal.  Right TM and canal is normal.              Assessment & Plan:  Acute diffuse otitis externa of left ear - Plan: amoxicillin -clavulanate (AUGMENTIN ) 875-125 MG tablet, fluconazole  (DIFLUCAN ) 100 MG tablet, neomycin -polymyxin-hydrocortisone (CORTISPORIN ) 3.5-10000-1 OTIC suspension   Otorrhea and chronic ear symptoms Chronic otorrhea with intermittent exacerbations, worsening over  three months. Previous treatments effective. Current symptoms include oil-like discharge, with external ear involvement and redness. - Prescribed antibiotics for potential infection beyond the external ear canal.  Type 2 diabetes mellitus, poorly controlled Poorly controlled diabetes with A1c of 11. Intolerance to Mounjaro  and metformin. Current Soliqua use with elevated blood sugars. No CGM in use. Establishing care with new provider. - Continue Soliqua, increase dose by 2 units every 2-3 days until controlled. - Encouraged follow-up with new diabetes provider. - Advised obtaining CGM for better monitoring.      Will also give Diflucan  to help with yeast vaginitis from the antibiotics. Also discussed increasing her Soliqua and working hard to get into see aPCP

## 2024-02-02 ENCOUNTER — Telehealth: Payer: Self-pay

## 2024-02-02 NOTE — Telephone Encounter (Signed)
 Left message for patient to call our office, as she was interested in a new patient appointment.  Left message to call back and we will get her scheduled with Orie Mackintosh.

## 2024-02-04 ENCOUNTER — Other Ambulatory Visit (HOSPITAL_COMMUNITY): Payer: Self-pay

## 2024-02-08 ENCOUNTER — Telehealth: Payer: Self-pay

## 2024-02-08 ENCOUNTER — Ambulatory Visit: Payer: Self-pay

## 2024-02-08 NOTE — Telephone Encounter (Signed)
 Please change appt on 12/12 with Tanya Zamora to NP appt.

## 2024-02-08 NOTE — Telephone Encounter (Signed)
 FYI Only or Action Required?: No NP appt scheduled on 03/03/2024  Patient was last seen in primary care on 01/27/2024 by Joyce Norleen BROCKS, MD.  Called Nurse Triage reporting Blood Sugar Problem.  Symptoms began ongoing.  Interventions attempted: Nothing.  Symptoms are: gradually worsening.  Triage Disposition: See Physician Within 24 Hours  Patient/caregiver understands and will follow disposition?: Yes - PT will go to UC for evaluation and medications prior to NP appt. Pt does have some diabetes medications at home.                         Copied from CRM #[id number]. Topic: Clinical - Red Word Triage >> Feb 08, 2024 12:09 PM China J wrote: [facility] that prompted transfer to Nurse Triage: Recent blood sugar reading of 282 and issues with feet burning. She is needing a new patient visit with Boby Mackintosh. Reason for Disposition  [1] Blood glucose > 240 mg/dL (86.6 mmol/L) AND [7] pregnant  Answer Assessment - Initial Assessment Questions 1. BLOOD GLUCOSE: What is your blood glucose level?      After coffee with sugar creamer 282 2. ONSET: When did you check the blood glucose?     This morning 3. USUAL RANGE: What is your glucose level usually? (e.g., usual fasting morning value, usual evening value)     No lower than 160  5. TYPE 1 or 2:  Do you know what type of diabetes you have?  (e.g., Type 1, Type 2, Gestational; doesn't know)      Type 2 6. INSULIN : Do you take insulin ? What type of insulin (s) do you use? What is the mode of delivery? (syringe, pen; injection or pump)?      no 7. DIABETES PILLS: Do you take any pills for your diabetes? If Yes, ask: Have you missed taking any pills recently?     no 8. OTHER SYMPTOMS: Do you have any symptoms? (e.g., fever, frequent urination, difficulty breathing, dizziness, weakness, vomiting)     Feet burning  Protocols used: Diabetes - High Blood Sugar-A-AH

## 2024-03-04 ENCOUNTER — Encounter: Payer: Self-pay | Admitting: Family Medicine

## 2024-03-04 ENCOUNTER — Other Ambulatory Visit (HOSPITAL_COMMUNITY): Payer: Self-pay

## 2024-03-04 ENCOUNTER — Telehealth (HOSPITAL_COMMUNITY): Payer: Self-pay

## 2024-03-04 ENCOUNTER — Ambulatory Visit: Admitting: Family Medicine

## 2024-03-04 ENCOUNTER — Other Ambulatory Visit: Payer: Self-pay

## 2024-03-04 VITALS — BP 114/80 | HR 70 | Temp 97.9°F | Ht 62.0 in | Wt 224.0 lb

## 2024-03-04 DIAGNOSIS — R350 Frequency of micturition: Secondary | ICD-10-CM | POA: Diagnosis not present

## 2024-03-04 DIAGNOSIS — Z7689 Persons encountering health services in other specified circumstances: Secondary | ICD-10-CM

## 2024-03-04 DIAGNOSIS — E1165 Type 2 diabetes mellitus with hyperglycemia: Secondary | ICD-10-CM | POA: Diagnosis not present

## 2024-03-04 DIAGNOSIS — R9431 Abnormal electrocardiogram [ECG] [EKG]: Secondary | ICD-10-CM

## 2024-03-04 DIAGNOSIS — M5432 Sciatica, left side: Secondary | ICD-10-CM | POA: Insufficient documentation

## 2024-03-04 DIAGNOSIS — E782 Mixed hyperlipidemia: Secondary | ICD-10-CM | POA: Diagnosis not present

## 2024-03-04 LAB — URINALYSIS, ROUTINE W REFLEX MICROSCOPIC
Bilirubin Urine: NEGATIVE
Ketones, ur: NEGATIVE
Leukocytes,Ua: NEGATIVE
Nitrite: NEGATIVE
Specific Gravity, Urine: 1.02 (ref 1.000–1.030)
Total Protein, Urine: NEGATIVE
Urine Glucose: >=1000 — AB
Urobilinogen, UA: 0.2 (ref 0.0–1.0)
pH: 6.5 (ref 5.0–8.0)

## 2024-03-04 LAB — LIPID PANEL
Cholesterol: 229 mg/dL — ABNORMAL HIGH (ref 0–200)
HDL: 41.3 mg/dL (ref >39.00)
LDL Cholesterol: 153 mg/dL — ABNORMAL HIGH (ref 0–99)
NonHDL: 187.88
Total CHOL/HDL Ratio: 6
Triglycerides: 172 mg/dL — ABNORMAL HIGH (ref 0.0–149.0)
VLDL: 34.4 mg/dL (ref 0.0–40.0)

## 2024-03-04 LAB — COMPREHENSIVE METABOLIC PANEL WITH GFR
ALT: 15 U/L (ref 0–35)
AST: 13 U/L (ref 0–37)
Albumin: 4.4 g/dL (ref 3.5–5.2)
Alkaline Phosphatase: 80 U/L (ref 39–117)
BUN: 10 mg/dL (ref 6–23)
CO2: 30 meq/L (ref 19–32)
Calcium: 9.5 mg/dL (ref 8.4–10.5)
Chloride: 98 meq/L (ref 96–112)
Creatinine, Ser: 0.65 mg/dL (ref 0.40–1.20)
GFR: 105.16 mL/min (ref >60.00)
Glucose, Bld: 245 mg/dL — ABNORMAL HIGH (ref 70–99)
Potassium: 4.3 meq/L (ref 3.5–5.1)
Sodium: 136 meq/L (ref 135–145)
Total Bilirubin: 1.2 mg/dL (ref 0.2–1.2)
Total Protein: 7.2 g/dL (ref 6.0–8.3)

## 2024-03-04 LAB — CBC WITH DIFFERENTIAL/PLATELET
Basophils Absolute: 0.1 K/uL (ref 0.0–0.1)
Basophils Relative: 1.2 % (ref 0.0–3.0)
Eosinophils Absolute: 0.2 K/uL (ref 0.0–0.7)
Eosinophils Relative: 3 % (ref 0.0–5.0)
HCT: 40.9 % (ref 36.0–46.0)
Hemoglobin: 14.4 g/dL (ref 12.0–15.0)
Lymphocytes Relative: 37.4 % (ref 12.0–46.0)
Lymphs Abs: 2.3 K/uL (ref 0.7–4.0)
MCHC: 35.2 g/dL (ref 30.0–36.0)
MCV: 95.1 fl (ref 78.0–100.0)
Monocytes Absolute: 0.2 K/uL (ref 0.1–1.0)
Monocytes Relative: 3.6 % (ref 3.0–12.0)
Neutro Abs: 3.4 K/uL (ref 1.4–7.7)
Neutrophils Relative %: 54.8 % (ref 43.0–77.0)
Platelets: 238 K/uL (ref 150.0–400.0)
RBC: 4.3 Mil/uL (ref 3.87–5.11)
RDW: 12.9 % (ref 11.5–15.5)
WBC: 6.2 K/uL (ref 4.0–10.5)

## 2024-03-04 LAB — MICROALBUMIN / CREATININE URINE RATIO
Creatinine,U: 82.4 mg/dL
Microalb Creat Ratio: 10.8 mg/g (ref 0.0–30.0)
Microalb, Ur: 0.9 mg/dL (ref 0.0–1.9)

## 2024-03-04 LAB — TSH: TSH: 1.54 u[IU]/mL (ref 0.35–5.50)

## 2024-03-04 LAB — HEMOGLOBIN A1C: Hgb A1c MFr Bld: 11 % — ABNORMAL HIGH (ref 4.6–6.5)

## 2024-03-04 LAB — T4, FREE: Free T4: 0.79 ng/dL (ref 0.60–1.60)

## 2024-03-04 MED ORDER — ROSUVASTATIN CALCIUM 10 MG PO TABS
10.0000 mg | ORAL_TABLET | Freq: Every day | ORAL | 1 refills | Status: AC
  Filled 2024-03-04: qty 90, 90d supply, fill #0

## 2024-03-04 MED ORDER — TOUJEO MAX SOLOSTAR 300 UNIT/ML ~~LOC~~ SOPN
10.0000 [IU] | PEN_INJECTOR | Freq: Every day | SUBCUTANEOUS | 0 refills | Status: AC
  Filled 2024-03-04: qty 3, 56d supply, fill #0

## 2024-03-04 MED ORDER — FREESTYLE LIBRE 3 SENSOR MISC
5 refills | Status: AC
  Filled 2024-03-04: qty 2, 28d supply, fill #0

## 2024-03-04 MED ORDER — TIRZEPATIDE 2.5 MG/0.5ML ~~LOC~~ SOAJ
2.5000 mg | SUBCUTANEOUS | 1 refills | Status: DC
  Filled 2024-03-04: qty 2, 28d supply, fill #0
  Filled 2024-03-27: qty 2, 28d supply, fill #1

## 2024-03-04 MED ORDER — ONDANSETRON 4 MG PO TBDP
4.0000 mg | ORAL_TABLET | Freq: Three times a day (TID) | ORAL | 0 refills | Status: DC | PRN
  Filled 2024-03-04: qty 20, 7d supply, fill #0

## 2024-03-04 NOTE — Progress Notes (Unsigned)
 New Patient Office Visit  Subjective    Patient ID: Tanya Zamora, female    DOB: Aug 02, 1975  Age: 48 y.o. MRN: [id number]  CC:  Chief Complaint  Patient presents with   Establish Care    Uncontrolled diabetes and would like to discuss. Also has issues with sciatic nerve on left side     Discussed the use of AI scribe software for clinical note transcription with the patient, who gave verbal consent to proceed.  History of Present Illness Tanya Zamora is a 48 year old female with uncontrolled diabetes who presents to establish care.  Hyperglycemia and diabetes management - Diagnosed with diabetes in 2013 - Last hemoglobin A1c over 11% in May 2025 - Best glycemic control with A1c of 8.1% in 2023 while on Mounjaro  - Currently off all diabetes medications - Not monitoring blood glucose at home - Previously stopped Toujeo  21 units due to inconsistent use - Significant weight loss since March, attributed to eating only once daily at work  Diabetic complications - Burning pain in both feet consistent with peripheral neuropathy - Worsening vision despite corrective lenses; last eye exam in 2024 - Urinary frequency, with symptoms complicated by a known dropped bladder  Dyslipidemia and statin intolerance - History of high cholesterol - Previously took atorvastatin , which caused muscle cramps that improved with every-other-day dosing - Off all chronic medications for a couple of years except for a brief restart of Toujeo   Musculoskeletal pain - Sciatica-like pain radiating from buttock into leg, worse at night  Tobacco use history - 30-year smoking history - Smoked approximately two packs per day for the last five years before quitting four years ago - No current tobacco use  Constitutional and cardiopulmonary symptoms - No dizziness, chest pain, palpitations, or shortness of breath      03/04/2024    9:34 AM 08/11/2022   11:14 AM 06/25/2022    7:53 AM 05/06/2022    11:35 AM 02/11/2022   11:38 AM  Depression screen PHQ 2/9  Decreased Interest 0 0 0 0 0  Down, Depressed, Hopeless 0 0 0 0 0  PHQ - 2 Score 0 0 0 0 0     Outpatient Encounter Medications as of 03/04/2024  Medication Sig   Continuous Glucose Sensor (FREESTYLE LIBRE 3 SENSOR) MISC Place 1 sensor on the skin every 14 days. Use to check glucose continuously at least 3x daily.   insulin  glargine, 2 Unit Dial , (TOUJEO  MAX SOLOSTAR) 300 UNIT/ML Solostar Pen Inject 10 Units into the skin daily. Discard pen 56 days after first use.   ondansetron  (ZOFRAN -ODT) 4 MG disintegrating tablet Dissolve 1 tablet (4 mg total) by mouth every 8 (eight) hours as needed for nausea or vomiting.   rosuvastatin  (CRESTOR ) 10 MG tablet Take 1 tablet (10 mg total) by mouth daily.   tirzepatide  (MOUNJARO ) 2.5 MG/0.5ML Pen Inject 2.5 mg into the skin once a week.   [DISCONTINUED] amoxicillin -clavulanate (AUGMENTIN ) 875-125 MG tablet Take 1 tablet by mouth 2 (two) times daily.   [DISCONTINUED] empagliflozin (JARDIANCE) 10 MG TABS tablet Take 10 mg by mouth daily.   [DISCONTINUED] fluconazole  (DIFLUCAN ) 100 MG tablet Take 1 tablet (100 mg total) by mouth daily.   [DISCONTINUED] HYDROcodone -acetaminophen  (NORCO/VICODIN) 5-325 MG tablet Take 1 tablet by mouth every 6 (six) hours as needed for moderate pain (pain score 4-6).   [DISCONTINUED] neomycin -polymyxin-hydrocortisone (CORTISPORIN ) 3.5-10000-1 OTIC suspension Place 3 drops into both ears 4 (four) times daily.   [DISCONTINUED] ondansetron  (ZOFRAN -ODT) 4 MG disintegrating tablet  Take 1 tablet (4 mg total) by mouth every 8 (eight) hours as needed for nausea or vomiting.   [DISCONTINUED] triamcinolone  cream (KENALOG ) 0.1 % Apply 1 Application topically 2 (two) times daily.   No facility-administered encounter medications on file as of 03/04/2024.    Past Medical History:  Diagnosis Date   Allergy    Chronic bronchitis (HCC)    Diabetes mellitus without complication  (HCC)    GERD (gastroesophageal reflux disease)     Past Surgical History:  Procedure Laterality Date   TONSILECTOMY, ADENOIDECTOMY, BILATERAL MYRINGOTOMY AND TUBES Bilateral     Family History  Problem Relation Age of Onset   Breast cancer Mother 69   Diabetes Mother    Stroke Mother    COPD Mother    Hypertension Mother    Hypertension Father    Cancer Father    Breast cancer Sister        30s   Breast cancer Maternal Grandmother     Social History   Socioeconomic History   Marital status: Married    Spouse name: Not on file   Number of children: 1   Years of education: Not on file   Highest education level: Some college, no degree  Occupational History   Not on file  Tobacco Use   Smoking status: Former    Current packs/day: 0.00    Average packs/day: 1 pack/day for 30.0 years (30.0 ttl pk-yrs)    Types: Cigarettes    Start date: 01/20/1991    Quit date: 01/19/2021    Years since quitting: 3.1   Smokeless tobacco: Never  Vaping Use   Vaping status: Every Day  Substance and Sexual Activity   Alcohol use: Not Currently   Drug use: Not Currently   Sexual activity: Yes    Birth control/protection: None  Other Topics Concern   Not on file  Social History Narrative   Not on file   Social Drivers of Health   Tobacco Use: Medium Risk (03/04/2024)   Patient History    Smoking Tobacco Use: Former    Smokeless Tobacco Use: Never    Passive Exposure: Not on Actuary Strain: Low Risk (02/26/2024)   Overall Financial Resource Strain (CARDIA)    Difficulty of Paying Living Expenses: Not very hard  Food Insecurity: No Food Insecurity (02/26/2024)   Epic    Worried About Radiation Protection Practitioner of Food in the Last Year: Never true    Ran Out of Food in the Last Year: Never true  Transportation Needs: No Transportation Needs (02/26/2024)   Epic    Lack of Transportation (Medical): No    Lack of Transportation (Non-Medical): No  Physical Activity:  Insufficiently Active (02/26/2024)   Exercise Vital Sign    Days of Exercise per Week: 2 days    Minutes of Exercise per Session: 20 min  Stress: No Stress Concern Present (02/26/2024)   [employer] of Occupational Health - Occupational Stress Questionnaire    Feeling of Stress: Only a little  Social Connections: Moderately Integrated (02/26/2024)   Social Connection and Isolation Panel    Frequency of Communication with Friends and Family: More than three times a week    Frequency of Social Gatherings with Friends and Family: Three times a week    Attends Religious Services: More than 4 times per year    Active Member of Clubs or Organizations: Yes    Attends Banker Meetings: More than 4 times per year  Marital Status: Separated  Intimate Partner Violence: Not on file  Depression (PHQ2-9): Low Risk (03/04/2024)   Depression (PHQ2-9)    PHQ-2 Score: 0  Alcohol Screen: Not on file  Housing: Low Risk (02/26/2024)   Epic    Unable to Pay for Housing in the Last Year: No    Number of Times Moved in the Last Year: 0    Homeless in the Last Year: No  Utilities: Not on file  Health Literacy: Not on file    Review of Systems  Constitutional:  Negative for chills, fever, malaise/fatigue and weight loss.  Eyes:  Positive for blurred vision.  Respiratory:  Negative for shortness of breath.   Cardiovascular:  Negative for chest pain, palpitations and leg swelling.  Gastrointestinal:  Negative for abdominal pain, constipation, diarrhea, nausea and vomiting.  Genitourinary:  Positive for frequency. Negative for dysuria and urgency.  Musculoskeletal:  Positive for back pain.  Neurological:  Negative for dizziness, focal weakness and headaches.  Psychiatric/Behavioral:  Negative for depression. The patient is not nervous/anxious.         Objective    BP 114/80   Pulse 70   Temp 97.9 F (36.6 C) (Temporal)   Ht 5' 2 (1.575 m)   Wt 224 lb (101.6 kg)   SpO2 98%    BMI 40.97 kg/m   Physical Exam Constitutional:      General: She is not in acute distress.    Appearance: She is obese. She is not ill-appearing.  HENT:     Mouth/Throat:     Mouth: Mucous membranes are moist.     Pharynx: Oropharynx is clear.  Eyes:     Extraocular Movements: Extraocular movements intact.     Conjunctiva/sclera: Conjunctivae normal.     Pupils: Pupils are equal, round, and reactive to light.  Cardiovascular:     Rate and Rhythm: Normal rate and regular rhythm.  Pulmonary:     Effort: Pulmonary effort is normal.     Breath sounds: Normal breath sounds.  Musculoskeletal:     Cervical back: Normal range of motion and neck supple.     Right lower leg: No edema.     Left lower leg: No edema.  Skin:    General: Skin is warm and dry.  Neurological:     General: No focal deficit present.     Mental Status: She is alert and oriented to person, place, and time.     Motor: No weakness.     Coordination: Coordination normal.     Gait: Gait normal.  Psychiatric:        Mood and Affect: Mood normal.        Behavior: Behavior normal.        Thought Content: Thought content normal.         Assessment & Plan:   Problem List Items Addressed This Visit     Mixed hyperlipidemia   Relevant Medications   rosuvastatin  (CRESTOR ) 10 MG tablet   Other Relevant Orders   Lipid panel (Completed)   Ambulatory referral to Cardiology   Morbid obesity (HCC)   Relevant Medications   insulin  glargine, 2 Unit Dial , (TOUJEO  MAX SOLOSTAR) 300 UNIT/ML Solostar Pen   tirzepatide  (MOUNJARO ) 2.5 MG/0.5ML Pen   Sciatica of left side   Uncontrolled type 2 diabetes mellitus with hyperglycemia (HCC) - Primary   Relevant Medications   Continuous Glucose Sensor (FREESTYLE LIBRE 3 SENSOR) MISC   insulin  glargine, 2 Unit Dial , (TOUJEO  MAX SOLOSTAR) 300  UNIT/ML Solostar Pen   rosuvastatin  (CRESTOR ) 10 MG tablet   tirzepatide  (MOUNJARO ) 2.5 MG/0.5ML Pen   Other Relevant Orders   EKG  12-Lead (Completed)   CBC with Differential/Platelet (Completed)   Comprehensive metabolic panel with GFR (Completed)   Hemoglobin A1c (Completed)   TSH (Completed)   T4, free (Completed)   Microalbumin / creatinine urine ratio (Completed)   Urinalysis, Routine w reflex microscopic (Completed)   Amb ref to Medical Nutrition Therapy-MNT   Ambulatory referral to Cardiology   Other Visit Diagnoses       Encounter to establish care         EKG abnormalities       Relevant Orders   Ambulatory referral to Cardiology     Urinary frequency           Assessment and Plan Assessment & Plan Uncontrolled type 2 diabetes mellitus Diagnosed in 2013 with A1c >11% in May 2025. Previously on Mounjaro  with A1c reduced to 8.1% in 2023. Currently not on medication. Reports burning sensation in feet and worsening vision. No recent blood sugar monitoring. Discussed risks of uncontrolled diabetes including neuropathy, kidney disease, and blindness. Emphasized importance of medication adherence and lifestyle changes. - Ordered A1c test - Prescribed Toujeo  starting at 10 units, with instructions to increase by 2 units if blood sugars are in the 200-300 range every 2-3 days - Attempted to obtain Mounjaro  with prior authorization - Prescribed continuous glucose monitor - Advised on dietary modifications including low-carb diet and hydration - Referred to medical nutritionist if affordable - Recommend diabetic eye exam - Prescribed Zofran  for nausea associated with Mounjaro  - Discussed potential referral to endocrinologist if needed  Mixed hyperlipidemia Previously on atorvastatin  but discontinued due to muscle cramps. Discussed alternative treatment options. - Prescribed Crestor  and instructed to monitor for tolerance  Morbid obesity Significant weight loss since March due to reduced meal frequency. Discussed dietary modifications and potential benefits of Mounjaro  for weight management. - Advised on  dietary modifications including low-carb diet and hydration - Discussed referral to medical nutritionist if affordable  Sciatica, left side Reports burning sensation in left leg, likely sciatica. Symptoms worsen at night. Discussed conservative management options. - Provided stretches for sciatica - Advised use of heating pad or ice pack - Recommended topical Voltaren gel - Will consider referral to Sports Medicine if symptoms persist  Abnormal electrocardiogram EKG shows normal sinus rhythm with questionable left atrial enlargement and low voltage QRS. Cannot rule out anterior infarct. No old EKGs for comparison. Discussed significant cardiovascular risk due to uncontrolled diabetes. - Placed referral to cardiology for further evaluation  Urinary frequency Reports urinary frequency and bladder issues.  - Ordered urine microalbumin test - hold off on SGLT-2     Return in about 4 weeks (around 04/01/2024) for Fasting follow up.   Boby Mackintosh, NP-C

## 2024-03-04 NOTE — Patient Instructions (Signed)
 Please go downstairs for labs and a urine test before you leave.  Start Toujeo  10 units daily.  You can increase your dose by 2 units every 2 or 3 days if your blood sugars are in the 200 range or higher.  I am sending Mounjaro  to the pharmacy and we will handle the prior authorization.  Hold off on Jardiance for now.  Once I have your lab results, I may send additional medication to the pharmacy.  Start Crestor for your cholesterol  I am ordering a continuous glucose monitor and we will see if this is affordable  I am referring you to the medical nutritionist for your diabetes.  We will see if this is affordable as well.  Please schedule a diabetic eye exam  Please schedule with an OB/GYN  Your EKG has some abnormalities.  I am referring you to cardiology and they will call you to schedule a visit.

## 2024-03-07 ENCOUNTER — Other Ambulatory Visit (HOSPITAL_COMMUNITY): Payer: Self-pay

## 2024-03-08 ENCOUNTER — Encounter: Payer: Self-pay | Admitting: Family Medicine

## 2024-03-08 ENCOUNTER — Ambulatory Visit: Payer: Self-pay | Admitting: Family Medicine

## 2024-03-18 ENCOUNTER — Other Ambulatory Visit: Payer: Self-pay | Admitting: Family Medicine

## 2024-03-21 ENCOUNTER — Other Ambulatory Visit (HOSPITAL_BASED_OUTPATIENT_CLINIC_OR_DEPARTMENT_OTHER): Payer: Self-pay

## 2024-03-21 ENCOUNTER — Encounter: Attending: Family Medicine | Admitting: Nutrition

## 2024-03-21 ENCOUNTER — Encounter: Payer: Self-pay | Admitting: Nutrition

## 2024-03-21 ENCOUNTER — Other Ambulatory Visit: Payer: Self-pay

## 2024-03-21 VITALS — Ht 62.0 in | Wt 223.2 lb

## 2024-03-21 DIAGNOSIS — E1165 Type 2 diabetes mellitus with hyperglycemia: Secondary | ICD-10-CM | POA: Insufficient documentation

## 2024-03-21 DIAGNOSIS — E118 Type 2 diabetes mellitus with unspecified complications: Secondary | ICD-10-CM | POA: Insufficient documentation

## 2024-03-21 MED ORDER — ONDANSETRON 4 MG PO TBDP
4.0000 mg | ORAL_TABLET | Freq: Three times a day (TID) | ORAL | 0 refills | Status: AC | PRN
  Filled 2024-03-21 – 2024-03-29 (×2): qty 20, 7d supply, fill #0

## 2024-03-21 NOTE — Progress Notes (Addendum)
 Diabetes Self-Management Education  Visit Type:  Dm Type 2  Appt. Start Time: 0800 Appt. End Time: 0915  03/21/2024 [employer] Employee Visit 1  [name], identified by name and date of birth, is a 48 y.o. female with a diagnosis of Diabetes: Type 2. Hasn't met with a dietitian before. Doesn't understand carb counting or meal planning.  Wants to lose weight and get off insulin . FBS 200's. Currently on 20 units Toujeo  and Mounjaro  2.5 mg/dl Has problems with neuropathy and eye sight. Is working on compliance with her medications. Has a Libre TIR 35%, TAR 56% and High > 250 is 9%. No low blood sugars GMI 8%, AVG Glu 197 mg/dl. .  Would like to talk to a therapist for emotional eating and mental health/depression issues.  ASSESSMENT Medications Ordered Prior to Encounter[1]  Past Medical History:  Diagnosis Date   Allergy    Chronic bronchitis (HCC)    Diabetes mellitus without complication (HCC)    GERD (gastroesophageal reflux disease)     Height 5' 2 (1.575 m), weight 223 lb 3.2 oz (101.2 kg). Body mass index is 40.82 kg/m. Lab Results  Component Value Date   HGBA1C 11.0 (H) 03/04/2024      Latest Ref Rng & Units 03/04/2024   10:36 AM 06/14/2023   11:30 AM 08/12/2022    9:13 PM  CMP  Glucose 70 - 99 mg/dL 754  618  788   BUN 6 - 23 mg/dL 10  15  10    Creatinine 0.40 - 1.20 mg/dL 9.34  9.33  9.25   Sodium 135 - 145 mEq/L 136  136  131   Potassium 3.5 - 5.1 mEq/L 4.3  3.9  3.0   Chloride 96 - 112 mEq/L 98  98  98   CO2 19 - 32 mEq/L 30  26  24    Calcium  8.4 - 10.5 mg/dL 9.5  8.9  8.8   Total Protein 6.0 - 8.3 g/dL 7.2  7.3  7.0   Total Bilirubin 0.2 - 1.2 mg/dL 1.2  2.2  1.2   Alkaline Phos 39 - 117 U/L 80  88  97   AST 0 - 37 U/L 13  19  14    ALT 0 - 35 U/L 15  22  16     Lipid Panel     Component Value Date/Time   CHOL 229 (H) 03/04/2024 1036   TRIG 172.0 (H) 03/04/2024 1036   HDL 41.30 03/04/2024 1036   CHOLHDL 6 03/04/2024 1036   VLDL 34.4 03/04/2024  1036   LDLCALC 153 (H) 03/04/2024 1036   Wt Readings from Last 3 Encounters:  03/21/24 223 lb 3.2 oz (101.2 kg)  03/04/24 224 lb (101.6 kg)  06/14/23 240 lb (108.9 kg)   Ht Readings from Last 3 Encounters:  03/21/24 5' 2 (1.575 m)  03/04/24 5' 2 (1.575 m)  06/14/23 5' 2 (1.575 m)   Body mass index is 40.82 kg/m. @BMIFA @ Facility age limit for growth %iles is 20 years. Facility age limit for growth %iles is 20 years.    Diabetes Self-Management Education - 03/21/24 0824       Health Coping   How would you rate your overall health? Fair      Psychosocial Assessment   Patient Belief/Attitude about Diabetes Motivated to manage diabetes    What is the hardest part about your diabetes right now, causing you the most concern, or is the most worrisome to you about your diabetes?   Taking/obtaining  medications;Making healty food and beverage choices;Checking blood sugar;Being active    Self-management support Doctor's office;Family;CDE visits    Other persons present Patient    Patient Concerns Nutrition/Meal planning;Medication;Monitoring;Healthy Lifestyle;Problem Solving;Glycemic Control;Weight Control    Special Needs None    Preferred Learning Style No preference indicated    Learning Readiness Ready    How often do you need to have someone help you when you read instructions, pamphlets, or other written materials from your doctor or pharmacy? 1 - Never    What is the last grade level you completed in school? 12      Pre-Education Assessment   Patient understands the diabetes disease and treatment process. Needs Instruction    Patient understands incorporating nutritional management into lifestyle. Needs Instruction    Patient undertands incorporating physical activity into lifestyle. Needs Instruction    Patient understands using medications safely. Needs Instruction    Patient understands monitoring blood glucose, interpreting and using results Needs Instruction    Patient  understands prevention, detection, and treatment of acute complications. Needs Instruction    Patient understands prevention, detection, and treatment of chronic complications. Needs Instruction    Patient understands how to develop strategies to address psychosocial issues. Needs Instruction    Patient understands how to develop strategies to promote health/change behavior. Needs Instruction      Complications   Last HgB A1C per patient/outside source 11.1 %    How often do you check your blood sugar? 3-4 times/day    Fasting Blood glucose range (mg/dL) 819-799    Postprandial Blood glucose range (mg/dL) >799    Number of hyperglycemic episodes ( >200mg /dL): Daily    Can you tell when your blood sugar is high? Yes    What do you do if your blood sugar is high? drink water    Have you had a dilated eye exam in the past 12 months? No    Have you had a dental exam in the past 12 months? No    Are you checking your feet? Yes    How many days per week are you checking your feet? 7      Dietary Intake   Breakfast skipped    Lunch 3 pm: Big Mac   OR  Salad, chicken, green beans,  water or Diet soda Pepsi Zero    Snack (afternoon) Fudge    Dinner [employer] (evening) Chief Financial Officer) water, diet sodas      Activity / Exercise   Activity / Exercise Type ADL's      Patient Education   Previous Diabetes Education No      Individualized Goals (developed by patient)   Nutrition Follow meal plan discussed;Carb counting;General guidelines for healthy choices and portions discussed    Physical Activity Exercise 3-5 times per week    Medications take my medication as prescribed    Monitoring  Consistenly use CGM    Problem Solving Eating Pattern;Medication consistency    Reducing Risk examine blood glucose patterns    Health Coping Ask for help with psychological, social, or emotional issues;Discuss barriers to diabetes care with support person/system (comment specifics as  needed)      Post-Education Assessment   Patient understands the diabetes disease and treatment process. Needs Review    Patient understands incorporating nutritional management into lifestyle. Needs Review    Patient undertands incorporating physical activity into lifestyle. Needs Review    Patient understands using medications safely. Needs Review  Patient understands monitoring blood glucose, interpreting and using results Needs Review    Patient understands prevention, detection, and treatment of acute complications. Needs Review    Patient understands prevention, detection, and treatment of chronic complications. Needs Review    Patient understands how to develop strategies to address psychosocial issues. Needs Review      Outcomes   Future DMSE 4-6 wks    Program Status Not Completed          Individualized Plan for Diabetes Self-Management Training:   Learning Objective:  Patient will have a greater understanding of diabetes self-management. Patient education plan is to attend individual and/or group sessions per assessed needs and concerns.   Plan:   Patient Instructions  Goals  Walk on lunch break Eat three meals per day Cut out fast foods and processed foods Record BS on BS log Drink only water and current diet sodas Increase Toujeo  2 units every three days until fasting blood sugar is less than 150 mg/dl.   Expected Outcomes:  Improved blood sugars and weight loss and better compliance with medications.   Education material provided: Food label handouts, A1C conversion sheet, My Plate, Support group flyer, and Diabetes Resources  If problems or questions, patient to contact team via:  Phone and Email  Future DSME appointment: 4-6 wks     [1]  Current Outpatient Medications on File Prior to Visit  Medication Sig Dispense Refill   Continuous Glucose Sensor (FREESTYLE LIBRE 3 SENSOR) MISC Place 1 sensor on the skin every 14 days. Use to check glucose  continuously at least 3x daily. 2 each 5   insulin  glargine, 2 Unit Dial , (TOUJEO  MAX SOLOSTAR) 300 UNIT/ML Solostar Pen Inject 10 Units into the skin daily. Discard pen 56 days after first use. 3 mL 0   ondansetron  (ZOFRAN -ODT) 4 MG disintegrating tablet Dissolve 1 tablet (4 mg total) by mouth every 8 (eight) hours as needed for nausea or vomiting. 20 tablet 0   rosuvastatin  (CRESTOR ) 10 MG tablet Take 1 tablet (10 mg total) by mouth daily. 90 tablet 1   tirzepatide  (MOUNJARO ) 2.5 MG/0.5ML Pen Inject 2.5 mg into the skin once a week. 2 mL 1   No current facility-administered medications on file prior to visit.

## 2024-03-21 NOTE — Patient Instructions (Signed)
 Goals  Walk on lunch break Eat three meals per day Cut out fast foods and processed foods Record BS on BS log Drink only and diet sodas Increase Toujeo  2 units every three days until fasting blood sugar is less than 150 mg/dl.

## 2024-03-23 ENCOUNTER — Encounter: Payer: Self-pay | Admitting: Pharmacist

## 2024-03-23 ENCOUNTER — Other Ambulatory Visit: Payer: Self-pay

## 2024-03-28 ENCOUNTER — Other Ambulatory Visit: Payer: Self-pay | Admitting: Family Medicine

## 2024-03-28 ENCOUNTER — Other Ambulatory Visit: Payer: Self-pay

## 2024-03-29 ENCOUNTER — Other Ambulatory Visit: Payer: Self-pay

## 2024-03-29 ENCOUNTER — Other Ambulatory Visit (HOSPITAL_COMMUNITY): Payer: Self-pay

## 2024-03-29 ENCOUNTER — Encounter (HOSPITAL_COMMUNITY): Payer: Self-pay

## 2024-03-29 MED ORDER — TIRZEPATIDE 5 MG/0.5ML ~~LOC~~ SOAJ
5.0000 mg | SUBCUTANEOUS | 1 refills | Status: AC
  Filled 2024-03-29 (×2): qty 2, 28d supply, fill #0

## 2024-03-30 ENCOUNTER — Encounter (HOSPITAL_COMMUNITY): Payer: Self-pay

## 2024-03-30 ENCOUNTER — Other Ambulatory Visit (HOSPITAL_COMMUNITY): Payer: Self-pay

## 2024-03-30 ENCOUNTER — Other Ambulatory Visit: Payer: Self-pay

## 2024-04-01 ENCOUNTER — Ambulatory Visit: Attending: Cardiovascular Disease | Admitting: Cardiovascular Disease

## 2024-04-01 ENCOUNTER — Encounter: Payer: Self-pay | Admitting: Cardiovascular Disease

## 2024-04-01 ENCOUNTER — Ambulatory Visit: Admitting: Family Medicine

## 2024-04-01 ENCOUNTER — Encounter: Payer: Self-pay | Admitting: Family Medicine

## 2024-04-01 VITALS — BP 122/76 | HR 73 | Temp 97.6°F | Ht 62.0 in | Wt 217.0 lb

## 2024-04-01 VITALS — BP 120/78 | HR 75 | Ht 62.0 in | Wt 216.0 lb

## 2024-04-01 DIAGNOSIS — E1165 Type 2 diabetes mellitus with hyperglycemia: Secondary | ICD-10-CM

## 2024-04-01 DIAGNOSIS — Z7985 Long-term (current) use of injectable non-insulin antidiabetic drugs: Secondary | ICD-10-CM | POA: Diagnosis not present

## 2024-04-01 DIAGNOSIS — E782 Mixed hyperlipidemia: Secondary | ICD-10-CM

## 2024-04-01 DIAGNOSIS — Z6839 Body mass index (BMI) 39.0-39.9, adult: Secondary | ICD-10-CM | POA: Diagnosis not present

## 2024-04-01 DIAGNOSIS — R9431 Abnormal electrocardiogram [ECG] [EKG]: Secondary | ICD-10-CM

## 2024-04-01 DIAGNOSIS — D1803 Hemangioma of intra-abdominal structures: Secondary | ICD-10-CM

## 2024-04-01 NOTE — Progress Notes (Unsigned)
" °  Cardiology Office Note   Date:  04/01/2024  ID:  Tanya Zamora, DOB [birth year], MRN [id number] PCP: Lendia Boby CROME, NP-C  [facility] Providers Cardiologist:  None { Click to update primary MD,subspecialty MD or APP then REFRESH:1}    History of Present Illness Tanya Zamora is a 49 y.o. female ***  ROS: ***  Studies Reviewed      *** Risk Assessment/Calculations {Does this patient have ATRIAL FIBRILLATION?[phone]}         Physical Exam VS:  BP 120/78   Pulse 75   Wt 216 lb (98 kg)   SpO2 96%   BMI 39.51 kg/m        Wt Readings from Last 3 Encounters:  04/01/24 216 lb (98 kg)  03/21/24 223 lb 3.2 oz (101.2 kg)  03/04/24 224 lb (101.6 kg)    GEN: Well nourished, well developed in no acute distress NECK: No JVD; No carotid bruits CARDIAC: ***RRR, no murmurs, rubs, gallops RESPIRATORY:  Clear to auscultation without rales, wheezing or rhonchi  ABDOMEN: Soft, non-tender, non-distended EXTREMITIES:  No edema; No deformity   ASSESSMENT AND PLAN ***    {Are you ordering a CV Procedure (e.g. stress test, cath, DCCV, TEE, etc)?   Press F2        :[id number]}  Dispo: ***  Signed, Jerel Balding, MD   "

## 2024-04-01 NOTE — Progress Notes (Signed)
 "  Subjective:     Patient ID: Tanya Zamora, female    DOB: [birth year], 49 y.o.   MRN: [id number]  Chief Complaint  Patient presents with   Follow-up    4 week f/u FASTING    HPI  Discussed the use of AI scribe software for clinical note transcription with the patient, who gave verbal consent to proceed.  History of Present Illness Tanya Zamora is a 49 year old female with poorly controlled diabetes who presents for follow-up.  Hyperglycemia and diabetes management - Previously with poorly controlled diabetes mellitus. - Working with a nutritionist since December 12; follows three meals daily. - Takes 22 units of insulin  glargine each morning for elevated fasting blood glucose. - Plans to start 5 mg of Mounjaro  tonight; has experienced nausea with Mounjaro  previously and may require ondansetron . - Home blood glucose values have improved from 160-170 mg/dL to approximately 859 mg/dL.  Cardiovascular risk and symptoms - Strong family history of cardiovascular disease: maternal strokes and paternal myocardial infarctions. - Recent episode of neck pain and heaviness in left arm prompted cardiology evaluation. - EKG was normal.  Hyperlipidemia management - Takes nightly cholesterol medication without adverse effects.  Lifestyle modification - Previously smoked two packs per day; has quit smoking. - Previously consumed large amounts of sugary beverages; has discontinued intake. - Has lost eight pounds and reports improved well-being with dietary changes.  Psychological health - Considering counseling for trauma related to father's passing. - Hesitant to pursue therapy due to prior negative experience.     Health Maintenance Due  Topic Date Due   OPHTHALMOLOGY EXAM  Never done   Pneumococcal Vaccine (1 of 2 - PCV) Never done   Hepatitis B Vaccines 19-59 Average Risk (1 of 3 - 19+ 3-dose series) Never done   FOOT EXAM  02/12/2023   Mammogram  07/28/2023    Past  Medical History:  Diagnosis Date   Allergy    Chronic bronchitis (HCC)    Diabetes mellitus without complication (HCC)    GERD (gastroesophageal reflux disease)     Past Surgical History:  Procedure Laterality Date   TONSILECTOMY, ADENOIDECTOMY, BILATERAL MYRINGOTOMY AND TUBES Bilateral     Family History  Problem Relation Age of Onset   Breast cancer Mother 54   Diabetes Mother    Stroke Mother    COPD Mother    Hypertension Mother    Hypertension Father    Cancer Father    Breast cancer Sister        30s   Breast cancer Maternal Grandmother     Social History   Socioeconomic History   Marital status: Married    Spouse name: Not on file   Number of children: 1   Years of education: Not on file   Highest education level: Some college, no degree  Occupational History   Not on file  Tobacco Use   Smoking status: Former    Current packs/day: 0.00    Average packs/day: 1 pack/day for 30.0 years (30.0 ttl pk-yrs)    Types: Cigarettes    Start date: 01/20/1991    Quit date: 01/19/2021    Years since quitting: 3.2   Smokeless tobacco: Never  Vaping Use   Vaping status: Every Day  Substance and Sexual Activity   Alcohol use: Not Currently   Drug use: Not Currently   Sexual activity: Yes    Birth control/protection: None  Other Topics Concern   Not on file  Social History Narrative  Not on file   Social Drivers of Health   Tobacco Use: Medium Risk (04/01/2024)   Patient History    Smoking Tobacco Use: Former    Smokeless Tobacco Use: Never    Passive Exposure: Not on file  Financial Resource Strain: Low Risk (02/26/2024)   Overall Financial Resource Strain (CARDIA)    Difficulty of Paying Living Expenses: Not very hard  Food Insecurity: No Food Insecurity (02/26/2024)   Epic    Worried About Programme Researcher, Broadcasting/film/video in the Last Year: Never true    Ran Out of Food in the Last Year: Never true  Transportation Needs: No Transportation Needs (02/26/2024)   Epic     Lack of Transportation (Medical): No    Lack of Transportation (Non-Medical): No  Physical Activity: Insufficiently Active (02/26/2024)   Exercise Vital Sign    Days of Exercise per Week: 2 days    Minutes of Exercise per Session: 20 min  Stress: No Stress Concern Present (02/26/2024)   [employer] of Occupational Health - Occupational Stress Questionnaire    Feeling of Stress: Only a little  Social Connections: Moderately Integrated (02/26/2024)   Social Connection and Isolation Panel    Frequency of Communication with Friends and Family: More than three times a week    Frequency of Social Gatherings with Friends and Family: Three times a week    Attends Religious Services: More than 4 times per year    Active Member of Clubs or Organizations: Yes    Attends Banker Meetings: More than 4 times per year    Marital Status: Separated  Intimate Partner Violence: Not on file  Depression (PHQ2-9): Low Risk (03/21/2024)   Depression (PHQ2-9)    PHQ-2 Score: 0  Alcohol Screen: Not on file  Housing: Low Risk (02/26/2024)   Epic    Unable to Pay for Housing in the Last Year: No    Number of Times Moved in the Last Year: 0    Homeless in the Last Year: No  Utilities: Not on file  Health Literacy: Not on file    Outpatient Medications Prior to Visit  Medication Sig Dispense Refill   Continuous Glucose Sensor (FREESTYLE LIBRE 3 SENSOR) MISC Place 1 sensor on the skin every 14 days. Use to check glucose continuously at least 3x daily. 2 each 5   insulin  glargine, 2 Unit Dial , (TOUJEO  MAX SOLOSTAR) 300 UNIT/ML Solostar Pen Inject 10 Units into the skin daily. Discard pen 56 days after first use. 3 mL 0   ondansetron  (ZOFRAN -ODT) 4 MG disintegrating tablet Dissolve 1 tablet (4 mg total) by mouth every 8 (eight) hours as needed for nausea or vomiting. 20 tablet 0   rosuvastatin  (CRESTOR ) 10 MG tablet Take 1 tablet (10 mg total) by mouth daily. 90 tablet 1   tirzepatide  (MOUNJARO )  5 MG/0.5ML Pen Inject 5 mg into the skin once a week. 2 mL 1   No facility-administered medications prior to visit.    Allergies[1]  Review of Systems  Constitutional:  Positive for weight loss. Negative for chills and fever.       Intentional   Respiratory:  Negative for shortness of breath.   Cardiovascular:  Negative for chest pain, palpitations and leg swelling.  Gastrointestinal:  Negative for abdominal pain, constipation, diarrhea, nausea and vomiting.  Genitourinary:  Negative for dysuria, frequency and urgency.  Neurological:  Negative for dizziness, focal weakness and headaches.       Objective:    Physical Exam  Constitutional:      General: She is not in acute distress.    Appearance: She is not ill-appearing.  Eyes:     Extraocular Movements: Extraocular movements intact.     Conjunctiva/sclera: Conjunctivae normal.  Cardiovascular:     Rate and Rhythm: Normal rate.  Pulmonary:     Effort: Pulmonary effort is normal.  Musculoskeletal:     Cervical back: Normal range of motion and neck supple.  Skin:    General: Skin is warm and dry.  Neurological:     General: No focal deficit present.     Mental Status: She is alert and oriented to person, place, and time.  Psychiatric:        Mood and Affect: Mood normal.        Behavior: Behavior normal.        Thought Content: Thought content normal.      BP 122/76   Pulse 73   Temp 97.6 F (36.4 C) (Temporal)   Ht 5' 2 (1.575 m)   Wt 217 lb (98.4 kg)   SpO2 96%   BMI 39.69 kg/m  Wt Readings from Last 3 Encounters:  04/01/24 217 lb (98.4 kg)  04/01/24 216 lb (98 kg)  03/21/24 223 lb 3.2 oz (101.2 kg)       Assessment & Plan:   Problem List Items Addressed This Visit     Mixed hyperlipidemia   Morbid obesity (HCC)   Uncontrolled type 2 diabetes mellitus with hyperglycemia (HCC) - Primary    Assessment and Plan Assessment & Plan Uncontrolled type 2 diabetes mellitus with hyperglycemia Diabetes  management is improving with dietary changes and medication adjustments. Blood glucose levels have decreased from 160-170 mg/dL to 859 mg/dL. GMI has improved to 8.0. She is titrating insulin  glargine to 22 units based on dietary advice. Mounjaro  is being titrated to 5 mg, with previous side effects of nausea managed with ondansetron . Kidney function is well-managed. - Continue insulin  glargine, titrate as needed. - Continue Mounjaro , titrate to 5 mg as tolerated. - Monitor blood glucose levels regularly. - Will consider ACE inhibitor or ARB in the future to protect kidney function. - Will schedule follow-up in three months to reassess diabetes control.  Morbid obesity Weight management is ongoing with dietary changes and medication. She has lost 8 pounds, contributing to improved diabetes control. Mounjaro  is expected to aid further in weight loss and glucose control. - Continue dietary modifications and Mounjaro  for weight management. - Encouraged continued weight loss efforts.  Mixed hyperlipidemia Cholesterol management is ongoing with rosuvastatin . She reports no adverse effects. - Continue rosuvastatin  10 mg daily. - Will schedule fasting lipid panel at next follow-up.     I am having Vanita Cannell maintain her FreeStyle Libre 3 Sensor, Toujeo  Max SoloStar, rosuvastatin , ondansetron , and tirzepatide .  No orders of the defined types were placed in this encounter.      [1]  Allergies Allergen Reactions   Metformin And Related Diarrhea and Nausea And Vomiting   "

## 2024-04-01 NOTE — Patient Instructions (Addendum)
 " Medication Instructions:  No changes  *If you need a refill on your cardiac medications before your next appointment, please call your pharmacy*   Lab Work: Not needed If you have labs (blood work) drawn today and your tests are completely normal, you will receive your results only by: MyChart Message (if you have MyChart) OR A paper copy in the mail If you have any lab test that is abnormal or we need to change your treatment, we will call you to review the results.   Testing/Procedures  You recommend you having Vascuscreen    CT coronary calcium  score.   Test locations:  [facility] [place]  [place]  [facility]  This is $99 out of pocket.   Coronary CalciumScan A coronary calcium  scan is an imaging test used to look for deposits of calcium  and other fatty materials (plaques) in the inner lining of the blood vessels of the heart (coronary arteries). These deposits of calcium  and plaques can partly clog and narrow the coronary arteries without producing any symptoms or warning signs. This puts a person at risk for a heart attack. This test can detect these deposits before symptoms develop. Tell a health care provider about: Any allergies you have. All medicines you are taking, including vitamins, herbs, eye drops, creams, and over-the-counter medicines. Any problems you or family members have had with anesthetic medicines. Any blood disorders you have. Any surgeries you have had. Any medical conditions you have. Whether you are pregnant or may be pregnant. What are the risks? Generally, this is a safe procedure. However, problems may occur, including: Harm to a pregnant woman and her unborn baby. This test involves the use of radiation. Radiation exposure can be dangerous to a pregnant woman and her unborn baby. If you are pregnant, you generally should not  have this procedure done. Slight increase in the risk of cancer. This is because of the radiation involved in the test. What happens before the procedure? No preparation is needed for this procedure. What happens during the procedure? You will undress and remove any jewelry around your neck or chest. You will put on a hospital gown. Sticky electrodes will be placed on your chest. The electrodes will be connected to an electrocardiogram (ECG) machine to record a tracing of the electrical activity of your heart. A CT scanner will take pictures of your heart. During this time, you will be asked to lie still and hold your breath for 2-3 seconds while a picture of your heart is being taken. The procedure may vary among health care providers and hospitals. What happens after the procedure? You can get dressed. You can return to your normal activities. It is up to you to get the results of your test. Ask your health care provider, or the department that is doing the test, when your results will be ready. Summary A coronary calcium  scan is an imaging test used to look for deposits of calcium  and other fatty materials (plaques) in the inner lining of the blood vessels of the heart (coronary arteries). Generally, this is a safe procedure. Tell your health care provider if you are pregnant or may be pregnant. No preparation is needed for this procedure. A CT scanner will take pictures of your heart. You can return to your normal activities after the scan is done. This information is not intended to replace advice given to you by your health  care provider. Make sure you discuss any questions you have with your health care provider. Document Released: 09/06/2007 Document Revised: 01/28/2016 Document Reviewed: 01/28/2016 Elsevier Interactive Patient Education  2017 [employer].    Follow-Up: At [facility], you and your health needs are our priority.  As part of our continuing mission to provide you  with exceptional heart care, we have created designated Provider Care Teams.  These Care Teams include your primary Cardiologist (physician) and Advanced Practice Providers (APPs -  Physician Assistants and Nurse Practitioners) who all work together to provide you with the care you need, when you need it.     Your next appointment:   As needed   The format for your next appointment:   In Person  Provider:   Jerel Balding, MD   Other Instructions  "

## 2024-04-02 ENCOUNTER — Encounter: Payer: Self-pay | Admitting: Cardiovascular Disease

## 2024-04-11 ENCOUNTER — Encounter: Payer: Self-pay | Attending: Family Medicine | Admitting: Nutrition

## 2024-04-11 ENCOUNTER — Encounter: Payer: Self-pay | Admitting: Nutrition

## 2024-04-11 NOTE — Patient Instructions (Signed)
 Goals  Eat 30 grams of carbs per meal Increase fruits, vegetables with meals Don't skip meals Exercise 60 minutes 4 days per week.

## 2024-04-11 NOTE — Progress Notes (Signed)
 Diabetes Self-Management Education  Visit Type:  Dm Type 2  Appt. Start Time: 1415 Appt. End Time: 1445  04/11/2024 [employer] Employee Visit 2 Mounjaro  5 mg now started a week ago. Toujeo  22 units now. Hasn't taken any Toujeo  this week because she had 2 low blood sugars in the mornings. Eating 1-2 meals per day. Not hungry. Has cut back on portions.  CGM shows her TIR 95%, 4% high and 1% low. GMI 6-7%  BS are much improved. She notes the GLP has helped her reduce her appetite and no be hungry. Has walked some. Trying to do bettter with eating consistent meals.  Suggested she reduce her Toujoe to 16 units at night. If she continues to have low blood sugars in the middle of the night, call PCP for further reduction in insulin  doses. Lost 5 lbs  since last visit.but has lost a total of 10 lbs .  Wt Readings from Last 3 Encounters:  04/11/24 212 lb 12.8 oz (96.5 kg)  04/01/24 217 lb (98.4 kg)  04/01/24 216 lb (98 kg)   Ht Readings from Last 3 Encounters:  04/11/24 5' 2 (1.575 m)  04/01/24 5' 2 (1.575 m)  04/01/24 5' 2 (1.575 m)   Body mass index is 38.92 kg/m. @BMIFA @ Facility age limit for growth %iles is 20 years. Facility age limit for growth %iles is 20 years.   ASSESSMENT Medications Ordered Prior to Encounter[1]  Past Medical History:  Diagnosis Date   Allergy    Chronic bronchitis (HCC)    Diabetes mellitus without complication (HCC)    GERD (gastroesophageal reflux disease)     There were no vitals taken for this visit. There is no height or weight on file to calculate BMI. Lab Results  Component Value Date   HGBA1C 11.0 (H) 03/04/2024      Latest Ref Rng & Units 03/04/2024   10:36 AM 06/14/2023   11:30 AM 08/12/2022    9:13 PM  CMP  Glucose 70 - 99 mg/dL 754  618  788   BUN 6 - 23 mg/dL 10  15  10    Creatinine 0.40 - 1.20 mg/dL 9.34  9.33  9.25   Sodium 135 - 145 mEq/L 136  136  131   Potassium 3.5 - 5.1 mEq/L 4.3  3.9  3.0   Chloride 96 - 112 mEq/L 98   98  98   CO2 19 - 32 mEq/L 30  26  24    Calcium  8.4 - 10.5 mg/dL 9.5  8.9  8.8   Total Protein 6.0 - 8.3 g/dL 7.2  7.3  7.0   Total Bilirubin 0.2 - 1.2 mg/dL 1.2  2.2  1.2   Alkaline Phos 39 - 117 U/L 80  88  97   AST 0 - 37 U/L 13  19  14    ALT 0 - 35 U/L 15  22  16     Lipid Panel     Component Value Date/Time   CHOL 229 (H) 03/04/2024 1036   TRIG 172.0 (H) 03/04/2024 1036   HDL 41.30 03/04/2024 1036   CHOLHDL 6 03/04/2024 1036   VLDL 34.4 03/04/2024 1036   LDLCALC 153 (H) 03/04/2024 1036   Wt Readings from Last 3 Encounters:  04/01/24 217 lb (98.4 kg)  04/01/24 216 lb (98 kg)  03/21/24 223 lb 3.2 oz (101.2 kg)   Ht Readings from Last 3 Encounters:  04/01/24 5' 2 (1.575 m)  04/01/24 5' 2 (1.575 m)  03/21/24 5' 2 (  1.575 m)   There is no height or weight on file to calculate BMI. @BMIFA @ Facility age limit for growth %iles is 20 years. Facility age limit for growth %iles is 20 years.      Individualized Plan for Diabetes Self-Management Training:   Learning Objective:  Patient will have a greater understanding of diabetes self-management. Patient education plan is to attend individual and/or group sessions per assessed needs and concerns.   Plan:   Patient Instructions  Goals  Contact PCP if blood sugars continue to drop below 70 Reduce Toujeo  to 16 units at night. Eat three meals per day Walk 30 minutes a day  Expected Outcomes:  Improved blood sugars and weight loss and better compliance with medications.   Education material provided: Food label handouts, A1C conversion sheet, My Plate, Support group flyer, and Diabetes Resources  If problems or questions, patient to contact team via:  Phone and Email  Future DSME appointment:       [1]  Current Outpatient Medications on File Prior to Visit  Medication Sig Dispense Refill   Continuous Glucose Sensor (FREESTYLE LIBRE 3 SENSOR) MISC Place 1 sensor on the skin every 14 days. Use to check glucose  continuously at least 3x daily. 2 each 5   insulin  glargine, 2 Unit Dial , (TOUJEO  MAX SOLOSTAR) 300 UNIT/ML Solostar Pen Inject 10 Units into the skin daily. Discard pen 56 days after first use. 3 mL 0   ondansetron  (ZOFRAN -ODT) 4 MG disintegrating tablet Dissolve 1 tablet (4 mg total) by mouth every 8 (eight) hours as needed for nausea or vomiting. 20 tablet 0   rosuvastatin  (CRESTOR ) 10 MG tablet Take 1 tablet (10 mg total) by mouth daily. 90 tablet 1   tirzepatide  (MOUNJARO ) 5 MG/0.5ML Pen Inject 5 mg into the skin once a week. 2 mL 1   No current facility-administered medications on file prior to visit.

## 2024-04-18 ENCOUNTER — Other Ambulatory Visit (HOSPITAL_COMMUNITY): Payer: Self-pay

## 2024-05-06 ENCOUNTER — Ambulatory Visit (HOSPITAL_COMMUNITY): Payer: Self-pay

## 2024-06-03 ENCOUNTER — Ambulatory Visit: Admitting: Family Medicine

## 2024-06-06 ENCOUNTER — Encounter: Admitting: Nutrition
